# Patient Record
Sex: Female | Born: 1985 | Race: White | Hispanic: No | Smoking: Never smoker
Health system: Southern US, Community
[De-identification: ages and names within clinical notes are randomized; demographics above are authoritative.]

## PROBLEM LIST (undated history)

## (undated) ENCOUNTER — Emergency Department (HOSPITAL_COMMUNITY): Admission: EM | Payer: BLUE CROSS/BLUE SHIELD | Source: Home / Self Care

## (undated) DIAGNOSIS — R0600 Dyspnea, unspecified: Secondary | ICD-10-CM

## (undated) DIAGNOSIS — R519 Headache, unspecified: Secondary | ICD-10-CM

## (undated) DIAGNOSIS — Z8489 Family history of other specified conditions: Secondary | ICD-10-CM

## (undated) DIAGNOSIS — R918 Other nonspecific abnormal finding of lung field: Secondary | ICD-10-CM

## (undated) DIAGNOSIS — F319 Bipolar disorder, unspecified: Secondary | ICD-10-CM

## (undated) DIAGNOSIS — F32A Depression, unspecified: Secondary | ICD-10-CM

## (undated) DIAGNOSIS — T8859XA Other complications of anesthesia, initial encounter: Secondary | ICD-10-CM

## (undated) DIAGNOSIS — F419 Anxiety disorder, unspecified: Secondary | ICD-10-CM

## (undated) DIAGNOSIS — N390 Urinary tract infection, site not specified: Secondary | ICD-10-CM

## (undated) DIAGNOSIS — J189 Pneumonia, unspecified organism: Secondary | ICD-10-CM

## (undated) DIAGNOSIS — R112 Nausea with vomiting, unspecified: Secondary | ICD-10-CM

## (undated) DIAGNOSIS — F329 Major depressive disorder, single episode, unspecified: Secondary | ICD-10-CM

## (undated) DIAGNOSIS — J45909 Unspecified asthma, uncomplicated: Secondary | ICD-10-CM

## (undated) DIAGNOSIS — Z9889 Other specified postprocedural states: Secondary | ICD-10-CM

## (undated) DIAGNOSIS — T4145XA Adverse effect of unspecified anesthetic, initial encounter: Secondary | ICD-10-CM

## (undated) DIAGNOSIS — R51 Headache: Secondary | ICD-10-CM

## (undated) DIAGNOSIS — R569 Unspecified convulsions: Secondary | ICD-10-CM

## (undated) HISTORY — DX: Other nonspecific abnormal finding of lung field: R91.8

## (undated) HISTORY — PX: KNEE ARTHROSCOPY: SUR90

## (undated) HISTORY — PX: OTHER SURGICAL HISTORY: SHX169

## (undated) HISTORY — PX: SKIN GRAFT: SHX250

---
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
## 2006-01-23 ENCOUNTER — Ambulatory Visit: Payer: Self-pay | Admitting: Pediatrics

## 2007-05-18 ENCOUNTER — Ambulatory Visit: Payer: Self-pay | Admitting: Unknown Physician Specialty

## 2007-05-25 ENCOUNTER — Ambulatory Visit: Payer: Self-pay | Admitting: Unknown Physician Specialty

## 2007-05-25 ENCOUNTER — Other Ambulatory Visit: Payer: Self-pay

## 2007-05-28 ENCOUNTER — Ambulatory Visit: Payer: Self-pay | Admitting: Unknown Physician Specialty

## 2008-04-16 ENCOUNTER — Emergency Department: Payer: Self-pay | Admitting: Emergency Medicine

## 2008-09-09 HISTORY — PX: LAPAROSCOPY: SHX197

## 2009-11-06 ENCOUNTER — Ambulatory Visit: Payer: Self-pay | Admitting: Family Medicine

## 2010-02-21 ENCOUNTER — Ambulatory Visit: Payer: Self-pay | Admitting: Family Medicine

## 2012-05-04 ENCOUNTER — Ambulatory Visit: Payer: Self-pay | Admitting: Family Medicine

## 2013-09-09 HISTORY — PX: OTHER SURGICAL HISTORY: SHX169

## 2014-01-26 ENCOUNTER — Ambulatory Visit: Payer: Self-pay | Admitting: Physical Medicine and Rehabilitation

## 2014-09-09 HISTORY — PX: LUMBAR FUSION: SHX111

## 2015-11-05 ENCOUNTER — Encounter: Payer: Self-pay | Admitting: Emergency Medicine

## 2015-11-05 ENCOUNTER — Emergency Department
Admission: EM | Admit: 2015-11-05 | Discharge: 2015-11-05 | Disposition: A | Discharge status: Home / Self Care | Payer: BLUE CROSS/BLUE SHIELD | Attending: Emergency Medicine | Admitting: Emergency Medicine

## 2015-11-05 DIAGNOSIS — R509 Fever, unspecified: Secondary | ICD-10-CM | POA: Diagnosis present

## 2015-11-05 DIAGNOSIS — J069 Acute upper respiratory infection, unspecified: Secondary | ICD-10-CM | POA: Diagnosis not present

## 2015-11-05 DIAGNOSIS — J029 Acute pharyngitis, unspecified: Secondary | ICD-10-CM

## 2015-11-05 DIAGNOSIS — B9789 Other viral agents as the cause of diseases classified elsewhere: Secondary | ICD-10-CM

## 2015-11-05 DIAGNOSIS — J028 Acute pharyngitis due to other specified organisms: Secondary | ICD-10-CM

## 2015-11-05 DIAGNOSIS — J988 Other specified respiratory disorders: Secondary | ICD-10-CM

## 2015-11-05 LAB — MONONUCLEOSIS SCREEN: Mono Screen: NEGATIVE

## 2015-11-05 MED ORDER — IBUPROFEN 800 MG PO TABS
800.0000 mg | ORAL_TABLET | Freq: Once | ORAL | Status: AC
  Administered 2015-11-05: 800 mg via ORAL
  Filled 2015-11-05: qty 1

## 2015-11-05 MED ORDER — IBUPROFEN 600 MG PO TABS: 600.0000 mg | ORAL_TABLET | Freq: Three times a day (TID) | ORAL | Status: DC | PRN

## 2015-11-05 MED ORDER — LIDOCAINE VISCOUS 2 % MT SOLN
15.0000 mL | Freq: Once | OROMUCOSAL | Status: AC
  Administered 2015-11-05: 15 mL via OROMUCOSAL
  Filled 2015-11-05: qty 15

## 2015-11-05 MED ORDER — DIPHENHYDRAMINE HCL 12.5 MG/5ML PO ELIX
25.0000 mg | ORAL_SOLUTION | Freq: Once | ORAL | Status: AC
  Administered 2015-11-05: 25 mg via ORAL
  Filled 2015-11-05: qty 10

## 2015-11-05 MED ORDER — KETOROLAC TROMETHAMINE 60 MG/2ML IM SOLN
60.0000 mg | Freq: Once | INTRAMUSCULAR | Status: AC
  Administered 2015-11-05: 60 mg via INTRAMUSCULAR
  Filled 2015-11-05: qty 2

## 2015-11-05 MED ORDER — PSEUDOEPH-BROMPHEN-DM 30-2-10 MG/5ML PO SYRP: 5.0000 mL | ORAL_SOLUTION | Freq: Four times a day (QID) | ORAL | Status: DC | PRN

## 2015-11-05 MED ORDER — LIDOCAINE VISCOUS 2 % MT SOLN: 5.0000 mL | Freq: Four times a day (QID) | OROMUCOSAL | Status: DC | PRN

## 2015-11-05 NOTE — ED Notes (Signed)
AAOx3.  Skin warm and dry. NAD.  Ambulates with easy and steady gait.  MAE equally and strong. 

## 2015-11-05 NOTE — Discharge Instructions (Signed)
Viral Infections A viral infection can be caused by different types of viruses.Most viral infections are not serious and resolve on their own. However, some infections may cause severe symptoms and may lead to further complications. SYMPTOMS Viruses can frequently cause:  Minor sore throat.  Aches and pains.  Headaches.  Runny nose.  Different types of rashes.  Watery eyes.  Tiredness.  Cough.  Loss of appetite.  Gastrointestinal infections, resulting in nausea, vomiting, and diarrhea. These symptoms do not respond to antibiotics because the infection is not caused by bacteria. However, you might catch a bacterial infection following the viral infection. This is sometimes called a "superinfection." Symptoms of such a bacterial infection may include:  Worsening sore throat with pus and difficulty swallowing.  Swollen neck glands.  Chills and a high or persistent fever.  Severe headache.  Tenderness over the sinuses.  Persistent overall ill feeling (malaise), muscle aches, and tiredness (fatigue).  Persistent cough.  Yellow, green, or brown mucus production with coughing. HOME CARE INSTRUCTIONS   Only take over-the-counter or prescription medicines for pain, discomfort, diarrhea, or fever as directed by your caregiver.  Drink enough water and fluids to keep your urine clear or pale yellow. Sports drinks can provide valuable electrolytes, sugars, and hydration.  Get plenty of rest and maintain proper nutrition. Soups and broths with crackers or rice are fine. SEEK IMMEDIATE MEDICAL CARE IF:   You have severe headaches, shortness of breath, chest pain, neck pain, or an unusual rash.  You have uncontrolled vomiting, diarrhea, or you are unable to keep down fluids.  You or your child has an oral temperature above 102 F (38.9 C), not controlled by medicine.  Your baby is older than 3 months with a rectal temperature of 102 F (38.9 C) or higher.  Your baby is 15  months old or younger with a rectal temperature of 100.4 F (38 C) or higher. MAKE SURE YOU:   Understand these instructions.  Will watch your condition.  Will get help right away if you are not doing well or get worse.   This information is not intended to replace advice given to you by your health care provider. Make sure you discuss any questions you have with your health care provider.   Document Released: 06/05/2005 Document Revised: 11/18/2011 Document Reviewed: 02/01/2015 Elsevier Interactive Patient Education 2016 [employer]  Pharyngitis Pharyngitis is a sore throat (pharynx). There is redness, pain, and swelling of your throat. HOME CARE   Drink enough fluids to keep your pee (urine) clear or pale yellow.  Only take medicine as told by your doctor.  You may get sick again if you do not take medicine as told. Finish your medicines, even if you start to feel better.  Do not take aspirin.  Rest.  Rinse your mouth (gargle) with salt water ( tsp of salt per 1 qt of water) every 1-2 hours. This will help the pain.  If you are not at risk for choking, you can suck on hard candy or sore throat lozenges. GET HELP IF:  You have large, tender lumps on your neck.  You have a rash.  You cough up green, yellow-brown, or bloody spit. GET HELP RIGHT AWAY IF:   You have a stiff neck.  You drool or cannot swallow liquids.  You throw up (vomit) or are not able to keep medicine or liquids down.  You have very bad pain that does not go away with medicine.  You have problems breathing (  not from a stuffy nose). MAKE SURE YOU:   Understand these instructions.  Will watch your condition.  Will get help right away if you are not doing well or get worse.   This information is not intended to replace advice given to you by your health care provider. Make sure you discuss any questions you have with your health care provider.   Document Released: 02/12/2008 Document  Revised: 06/16/2013 Document Reviewed: 05/03/2013 Elsevier Interactive Patient Education [employer].

## 2015-11-05 NOTE — ED Notes (Signed)
C/o fever, cough, sore throat x 3 days.  Seen at urgent care yesterday.  STrep and Flu were negative.  Started on Amoxicillin, but symptoms worsened today.

## 2015-11-05 NOTE — ED Provider Notes (Signed)
[facility] Emergency Department Provider Note  ____________________________________________  Time seen: Approximately 4:37 PM  I have reviewed the triage vital signs and the nursing notes.   HISTORY  Chief Complaint Cough and Fever    HPI Kathy Welch is a 30 y.o. female patient complain fever cough sore throat for 3 days. Patient stated urgent care yesterday. Patient say strep. Rapid flu were negative. Patient started amoxicillin status that has worsened today.Patient state increased fatigue today with nasal and chest congestion.   History reviewed. No pertinent past medical history.  There are no active problems to display for this patient.   Past Surgical History  Procedure Laterality Date  . Back surgeries      Current Outpatient Rx  Name  Route  Sig  Dispense  Refill  . brompheniramine-pseudoephedrine-DM 30-2-10 MG/5ML syrup   Oral   Take 5 mLs by mouth 4 (four) times daily as needed. Mixed with 5 mL of viscous lidocaine for swish and swallow.   120 mL   0   . ibuprofen (ADVIL,MOTRIN) 600 MG tablet   Oral   Take 1 tablet (600 mg total) by mouth every 8 (eight) hours as needed.   15 tablet   0   . lidocaine (XYLOCAINE) 2 % solution   Mouth/Throat   Use as directed 5 mLs in the mouth or throat every 6 (six) hours as needed for mouth pain. Mixed with 5 mL of Bromfed-DM for swish and swallow.   100 mL   0     Allergies Sulfa antibiotics  No family history on file.  Social History Social History  Substance Use Topics  . Smoking status: Never Smoker   . Smokeless tobacco: None  . Alcohol Use: None    Review of Systems Constitutional: Fever and malaise Eyes: No visual changes. ENT sore throat and nasal congestion Cardiovascular: Denies chest pain. Respiratory: Denies shortness of breath. Chest congestion Gastrointestinal: No abdominal pain.  No nausea, no vomiting.  No diarrhea.  No constipation. Genitourinary:  Negative for dysuria. Musculoskeletal: Negative for back pain. Skin: Negative for rash. Neurological: Negative for headaches, focal weakness or numbness. Hematological/Lymphatic: Allergic/Immunilogical: Sulfa antibiotics    ____________________________________________   PHYSICAL EXAM:  VITAL SIGNS: ED Triage Vitals  Enc Vitals Group     BP 11/05/15 1619 124/84 mmHg     Pulse Rate 11/05/15 1619 108     Resp 11/05/15 1616 16     Temp 11/05/15 1616 100.1 F (37.8 C)     Temp src --      SpO2 11/05/15 1619 98 %     Weight 11/05/15 1616 135 lb (61.236 kg)     Height 11/05/15 1616 5\' 2"  (1.575 m)     Head Cir --      Peak Flow --      Pain Score 11/05/15 1617 8     Pain Loc --      Pain Edu? --      Excl. in [place]? --     Constitutional: Alert and oriented. Well appearing and in no acute distress. Eyes: Conjunctivae are normal. PERRL. EOMI. Head: Atraumatic. Nose: No congestion/rhinnorhea. Mouth/Throat: Mucous membranes are moist.  Oropharynx erythematous , exudative tonsils Neck: No stridor.  No cervical spine tenderness to palpation.. Hematological/Lymphatic/Immunilogical: No cervical lymphadenopathy. Cardiovascular: Normal rate, regular rhythm. Grossly normal heart sounds.  Good peripheral circulation. Respiratory: Normal respiratory effort.  No retractions. Lungs CTAB. Gastrointestinal: Soft and nontender. No distention. No abdominal bruits. No CVA tenderness.  Musculoskeletal: No lower extremity tenderness nor edema.  No joint effusions. Neurologic:  Normal speech and language. No gross focal neurologic deficits are appreciated. No gait instability. Skin:  Skin is warm, dry and intact. No rash noted. Psychiatric: Mood and affect are normal. Speech and behavior are normal.  ____________________________________________   LABS (all labs ordered are listed, but only abnormal results are displayed)  Labs Reviewed  MONONUCLEOSIS SCREEN    ____________________________________________  EKG   ____________________________________________  RADIOLOGY   ____________________________________________   PROCEDURES  Procedure(s) performed: None  Critical Care performed: No  ____________________________________________   INITIAL IMPRESSION / ASSESSMENT AND PLAN / ED COURSE  Pertinent labs & imaging results that were available during my care of the patient were reviewed by me and considered in my medical decision making (see chart for details).  Viral illness. Discussed negative Monospot results with patient. Advised patient to continue taking previous medications. Patient given a prescription for Bromfed-DM, viscous lidocaine, and ibuprofen. ____________________________________________   FINAL CLINICAL IMPRESSION(S) / ED DIAGNOSES  Final diagnoses:  Viral respiratory illness  Acute viral pharyngitis      Sable Feil, PA-C 11/05/15 2243  Harvest Dark, MD 11/05/15 2257

## 2016-01-18 ENCOUNTER — Encounter
Admission: RE | Admit: 2016-01-18 | Discharge: 2016-01-18 | Disposition: A | Discharge status: Home / Self Care | Payer: BLUE CROSS/BLUE SHIELD | Source: Ambulatory Visit | Attending: Obstetrics and Gynecology | Admitting: Obstetrics and Gynecology

## 2016-01-18 DIAGNOSIS — Z01812 Encounter for preprocedural laboratory examination: Secondary | ICD-10-CM | POA: Insufficient documentation

## 2016-01-18 HISTORY — DX: Unspecified asthma, uncomplicated: J45.909

## 2016-01-18 HISTORY — DX: Unspecified convulsions: R56.9

## 2016-01-18 HISTORY — DX: Urinary tract infection, site not specified: N39.0

## 2016-01-18 HISTORY — DX: Other complications of anesthesia, initial encounter: T88.59XA

## 2016-01-18 HISTORY — DX: Adverse effect of unspecified anesthetic, initial encounter: T41.45XA

## 2016-01-18 HISTORY — DX: Anxiety disorder, unspecified: F41.9

## 2016-01-18 HISTORY — DX: Bipolar disorder, unspecified: F31.9

## 2016-01-18 LAB — BASIC METABOLIC PANEL
Anion gap: 5 (ref 5–15)
BUN: 15 mg/dL (ref 6–20)
CHLORIDE: 107 mmol/L (ref 101–111)
CO2: 25 mmol/L (ref 22–32)
Calcium: 9.1 mg/dL (ref 8.9–10.3)
Creatinine, Ser: 0.64 mg/dL (ref 0.44–1.00)
GFR calc Af Amer: >60 mL/min (ref >60)
GFR calc non Af Amer: >60 mL/min (ref >60)
GLUCOSE: 86 mg/dL (ref 65–99)
POTASSIUM: 4.4 mmol/L (ref 3.5–5.1)
Sodium: 137 mmol/L (ref 135–145)

## 2016-01-18 LAB — CBC
HEMATOCRIT: 36.6 % (ref 35.0–47.0)
HEMOGLOBIN: 12.4 g/dL (ref 12.0–16.0)
MCH: 30.3 pg (ref 26.0–34.0)
MCHC: 34 g/dL (ref 32.0–36.0)
MCV: 89.3 fL (ref 80.0–100.0)
Platelets: 306 10*3/uL (ref 150–440)
RBC: 4.1 MIL/uL (ref 3.80–5.20)
RDW: 13.8 % (ref 11.5–14.5)
WBC: 6.1 10*3/uL (ref 3.6–11.0)

## 2016-01-18 LAB — SURGICAL PCR SCREEN
MRSA, PCR: NEGATIVE
Staphylococcus aureus: POSITIVE — AB

## 2016-01-18 LAB — TYPE AND SCREEN
ABO/RH(D): A POS
Antibody Screen: NEGATIVE

## 2016-01-18 LAB — ABO/RH: ABO/RH(D): A POS

## 2016-01-18 NOTE — Patient Instructions (Signed)
  Your procedure is scheduled on: 01/22/16 Mon Report to Same Day Surgery 2nd floor medical mall To find out your arrival time please call [phone] between 1PM - 3PM on 01/19/16 Fri  Remember: Instructions that are not followed completely may result in serious medical risk, up to and including death, or upon the discretion of your surgeon and anesthesiologist your surgery may need to be rescheduled.    _x___ 1. Do not eat food or drink liquids after midnight. No gum chewing or hard candies.     __x__ 2. No Alcohol for 24 hours before or after surgery.   ____ 3. Bring all medications with you on the day of surgery if instructed.    __x__ 4. Notify your doctor if there is any change in your medical condition     (cold, fever, infections).     Do not wear jewelry, make-up, hairpins, clips or nail polish.  Do not wear lotions, powders, or perfumes. You may wear deodorant.  Do not shave 48 hours prior to surgery. Men may shave face and neck.  Do not bring valuables to the hospital.    [facility] is not responsible for any belongings or valuables.               Contacts, dentures or bridgework may not be worn into surgery.  Leave your suitcase in the car. After surgery it may be brought to your room.  For patients admitted to the hospital, discharge time is determined by your treatment team.   Patients discharged the day of surgery will not be allowed to drive home.    Please read over the following fact sheets that you were given:   [facility] Preparing for Surgery and or MRSA Information   _x___ Take these medicines the morning of surgery with A SIP OF WATER:    1. sertraline (ZOLOFT) 50 MG tablet  2.  3.  4.  5.  6.  ____ Fleet Enema (as directed)   _x___ Use CHG Soap or sage wipes as directed on instruction sheet   ____ Use inhalers on the day of surgery and bring to hospital day of surgery  ____ Stop metformin 2 days prior to surgery    ____ Take 1/2 of usual  insulin dose the night before surgery and none on the morning of           surgery.   ____ Stop aspirin or coumadin, or plavix  _x__ Stop Anti-inflammatories such as Advil, Aleve, Ibuprofen, Motrin, Naproxen,          Naprosyn, Goodies powders or aspirin products. Ok to take Tylenol.   ____ Stop supplements until after surgery.    ____ Bring C-Pap to the hospital.
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
## 2016-01-18 NOTE — Pre-Procedure Instructions (Signed)
MRSA Neg, Staph Positive faxed results to Dr Leafy Ro.

## 2016-01-22 ENCOUNTER — Ambulatory Visit: Payer: BLUE CROSS/BLUE SHIELD | Admitting: Anesthesiology

## 2016-01-22 ENCOUNTER — Observation Stay
Admission: RE | Admit: 2016-01-22 | Discharge: 2016-01-23 | Disposition: A | Discharge status: Home / Self Care | Payer: BLUE CROSS/BLUE SHIELD | Source: Ambulatory Visit | Attending: Obstetrics and Gynecology | Admitting: Obstetrics and Gynecology

## 2016-01-22 ENCOUNTER — Encounter
Admission: RE | Disposition: A | Discharge status: Home / Self Care | Payer: Self-pay | Source: Ambulatory Visit | Attending: Obstetrics and Gynecology

## 2016-01-22 DIAGNOSIS — F419 Anxiety disorder, unspecified: Secondary | ICD-10-CM | POA: Insufficient documentation

## 2016-01-22 DIAGNOSIS — N803 Endometriosis of pelvic peritoneum: Secondary | ICD-10-CM | POA: Diagnosis not present

## 2016-01-22 DIAGNOSIS — J45909 Unspecified asthma, uncomplicated: Secondary | ICD-10-CM | POA: Diagnosis not present

## 2016-01-22 DIAGNOSIS — Z833 Family history of diabetes mellitus: Secondary | ICD-10-CM | POA: Insufficient documentation

## 2016-01-22 DIAGNOSIS — Z79899 Other long term (current) drug therapy: Secondary | ICD-10-CM | POA: Diagnosis not present

## 2016-01-22 DIAGNOSIS — F319 Bipolar disorder, unspecified: Secondary | ICD-10-CM | POA: Diagnosis not present

## 2016-01-22 DIAGNOSIS — D3610 Benign neoplasm of peripheral nerves and autonomic nervous system, unspecified: Secondary | ICD-10-CM | POA: Diagnosis not present

## 2016-01-22 DIAGNOSIS — Z8249 Family history of ischemic heart disease and other diseases of the circulatory system: Secondary | ICD-10-CM | POA: Insufficient documentation

## 2016-01-22 DIAGNOSIS — Z800 Family history of malignant neoplasm of digestive organs: Secondary | ICD-10-CM | POA: Insufficient documentation

## 2016-01-22 DIAGNOSIS — N736 Female pelvic peritoneal adhesions (postinfective): Secondary | ICD-10-CM | POA: Insufficient documentation

## 2016-01-22 DIAGNOSIS — R1031 Right lower quadrant pain: Secondary | ICD-10-CM | POA: Diagnosis not present

## 2016-01-22 DIAGNOSIS — G43909 Migraine, unspecified, not intractable, without status migrainosus: Secondary | ICD-10-CM | POA: Diagnosis not present

## 2016-01-22 DIAGNOSIS — N809 Endometriosis, unspecified: Secondary | ICD-10-CM | POA: Diagnosis present

## 2016-01-22 DIAGNOSIS — G8929 Other chronic pain: Secondary | ICD-10-CM | POA: Diagnosis not present

## 2016-01-22 DIAGNOSIS — R102 Pelvic and perineal pain: Secondary | ICD-10-CM | POA: Insufficient documentation

## 2016-01-22 DIAGNOSIS — Z882 Allergy status to sulfonamides status: Secondary | ICD-10-CM | POA: Diagnosis not present

## 2016-01-22 HISTORY — PX: LAPAROSCOPIC APPENDECTOMY: SHX408

## 2016-01-22 HISTORY — PX: LAPAROSCOPIC LYSIS OF ADHESIONS: SHX5905

## 2016-01-22 HISTORY — PX: CYSTOSCOPY: SHX5120

## 2016-01-22 HISTORY — PX: CHROMOPERTUBATION: SHX6288

## 2016-01-22 HISTORY — PX: LAPAROSCOPY: SHX197

## 2016-01-22 LAB — PREGNANCY, URINE: PREG TEST UR: NEGATIVE

## 2016-01-22 LAB — CHLAMYDIA/NGC RT PCR (ARMC ONLY)
Chlamydia Tr: NOT DETECTED
N gonorrhoeae: NOT DETECTED

## 2016-01-22 LAB — POCT PREGNANCY, URINE: Preg Test, Ur: NEGATIVE

## 2016-01-22 SURGERY — LAPAROSCOPY, DIAGNOSTIC
Anesthesia: General | Wound class: Clean Contaminated

## 2016-01-22 MED ORDER — FENTANYL CITRATE (PF) 100 MCG/2ML IJ SOLN
25.0000 ug | INTRAMUSCULAR | Status: AC | PRN
  Administered 2016-01-22 (×6): 25 ug via INTRAVENOUS

## 2016-01-22 MED ORDER — LACTATED RINGERS IV SOLN: INTRAVENOUS | Status: DC

## 2016-01-22 MED ORDER — ONDANSETRON HCL 4 MG/2ML IJ SOLN
INTRAMUSCULAR | Status: AC
  Filled 2016-01-22: qty 2

## 2016-01-22 MED ORDER — FENTANYL CITRATE (PF) 100 MCG/2ML IJ SOLN
INTRAMUSCULAR | Status: DC | PRN
  Administered 2016-01-22 (×3): 100 ug via INTRAVENOUS
  Administered 2016-01-22: 150 ug via INTRAVENOUS

## 2016-01-22 MED ORDER — LACTATED RINGERS IV SOLN
INTRAVENOUS | Status: DC
  Administered 2016-01-23: 01:00:00 via INTRAVENOUS

## 2016-01-22 MED ORDER — KETOROLAC TROMETHAMINE 30 MG/ML IJ SOLN
INTRAMUSCULAR | Status: DC | PRN
  Administered 2016-01-22: 30 mg via INTRAVENOUS

## 2016-01-22 MED ORDER — LIDOCAINE HCL (CARDIAC) 20 MG/ML IV SOLN
INTRAVENOUS | Status: DC | PRN
  Administered 2016-01-22: 100 mg via INTRAVENOUS

## 2016-01-22 MED ORDER — ONDANSETRON HCL 4 MG PO TABS: 4.0000 mg | ORAL_TABLET | Freq: Four times a day (QID) | ORAL | Status: DC | PRN

## 2016-01-22 MED ORDER — PROMETHAZINE HCL 25 MG RE SUPP: 25.0000 mg | Freq: Four times a day (QID) | RECTAL | Status: DC | PRN

## 2016-01-22 MED ORDER — SODIUM CHLORIDE 0.9% FLUSH: 9.0000 mL | INTRAVENOUS | Status: DC | PRN

## 2016-01-22 MED ORDER — DIPHENHYDRAMINE HCL 50 MG/ML IJ SOLN: 12.5000 mg | Freq: Four times a day (QID) | INTRAMUSCULAR | Status: DC | PRN

## 2016-01-22 MED ORDER — ACETAMINOPHEN 10 MG/ML IV SOLN
INTRAVENOUS | Status: AC
  Filled 2016-01-22: qty 100

## 2016-01-22 MED ORDER — ROCURONIUM BROMIDE 100 MG/10ML IV SOLN
INTRAVENOUS | Status: DC | PRN
  Administered 2016-01-22: 10 mg via INTRAVENOUS
  Administered 2016-01-22: 15 mg via INTRAVENOUS
  Administered 2016-01-22 (×3): 10 mg via INTRAVENOUS
  Administered 2016-01-22: 35 mg via INTRAVENOUS
  Administered 2016-01-22: 10 mg via INTRAVENOUS

## 2016-01-22 MED ORDER — HYDROMORPHONE HCL 1 MG/ML IJ SOLN
INTRAMUSCULAR | Status: AC
  Administered 2016-01-22: 0.25 mg via INTRAVENOUS
  Filled 2016-01-22: qty 1

## 2016-01-22 MED ORDER — DIPHENHYDRAMINE HCL 12.5 MG/5ML PO ELIX
12.5000 mg | ORAL_SOLUTION | Freq: Four times a day (QID) | ORAL | Status: DC | PRN
  Filled 2016-01-22: qty 5

## 2016-01-22 MED ORDER — ACETAMINOPHEN 10 MG/ML IV SOLN
INTRAVENOUS | Status: DC | PRN
  Administered 2016-01-22: 1000 mg via INTRAVENOUS

## 2016-01-22 MED ORDER — DOCUSATE SODIUM 100 MG PO CAPS
100.0000 mg | ORAL_CAPSULE | Freq: Two times a day (BID) | ORAL | Status: DC
  Administered 2016-01-22 – 2016-01-23 (×2): 100 mg via ORAL
  Filled 2016-01-22 (×2): qty 1

## 2016-01-22 MED ORDER — FAMOTIDINE 20 MG PO TABS
20.0000 mg | ORAL_TABLET | Freq: Once | ORAL | Status: AC
  Administered 2016-01-22: 20 mg via ORAL

## 2016-01-22 MED ORDER — METHYLENE BLUE 0.5 % INJ SOLN
INTRAVENOUS | Status: AC
  Filled 2016-01-22: qty 10

## 2016-01-22 MED ORDER — DOXYCYCLINE HYCLATE 100 MG PO TABS
100.0000 mg | ORAL_TABLET | Freq: Two times a day (BID) | ORAL | Status: DC
  Administered 2016-01-22: 100 mg via ORAL
  Filled 2016-01-22 (×4): qty 1

## 2016-01-22 MED ORDER — PHENYLEPHRINE HCL 10 MG/ML IJ SOLN
INTRAMUSCULAR | Status: DC | PRN
  Administered 2016-01-22 (×2): 200 ug via INTRAVENOUS
  Administered 2016-01-22: 100 ug via INTRAVENOUS
  Administered 2016-01-22: 200 ug via INTRAVENOUS
  Administered 2016-01-22: 100 ug via INTRAVENOUS

## 2016-01-22 MED ORDER — SCOPOLAMINE 1 MG/3DAYS TD PT72
MEDICATED_PATCH | TRANSDERMAL | Status: AC
  Filled 2016-01-22: qty 1

## 2016-01-22 MED ORDER — DOXYCYCLINE HYCLATE 100 MG PO TABS
100.0000 mg | ORAL_TABLET | Freq: Two times a day (BID) | ORAL | Status: DC
  Administered 2016-01-22 – 2016-01-23 (×2): 100 mg via ORAL
  Filled 2016-01-22 (×2): qty 1

## 2016-01-22 MED ORDER — DEXAMETHASONE SODIUM PHOSPHATE 4 MG/ML IJ SOLN
INTRAMUSCULAR | Status: DC | PRN
  Administered 2016-01-22: 5 mg via INTRAVENOUS

## 2016-01-22 MED ORDER — NEOSTIGMINE METHYLSULFATE 10 MG/10ML IV SOLN
INTRAVENOUS | Status: DC | PRN
  Administered 2016-01-22: 4 mg via INTRAVENOUS

## 2016-01-22 MED ORDER — SODIUM CHLORIDE 0.9 % IJ SOLN
INTRAMUSCULAR | Status: AC
  Filled 2016-01-22: qty 50

## 2016-01-22 MED ORDER — ONDANSETRON HCL 4 MG/2ML IJ SOLN
4.0000 mg | Freq: Once | INTRAMUSCULAR | Status: AC | PRN
  Administered 2016-01-22: 4 mg via INTRAVENOUS

## 2016-01-22 MED ORDER — KETOROLAC TROMETHAMINE 30 MG/ML IJ SOLN: 30.0000 mg | Freq: Once | INTRAMUSCULAR | Status: DC

## 2016-01-22 MED ORDER — ONDANSETRON HCL 4 MG/2ML IJ SOLN
INTRAMUSCULAR | Status: DC | PRN
  Administered 2016-01-22: 4 mg via INTRAVENOUS

## 2016-01-22 MED ORDER — FAMOTIDINE 20 MG PO TABS
ORAL_TABLET | ORAL | Status: AC
  Administered 2016-01-22: 20 mg via ORAL
  Filled 2016-01-22: qty 1

## 2016-01-22 MED ORDER — SCOPOLAMINE 1 MG/3DAYS TD PT72
1.0000 | MEDICATED_PATCH | TRANSDERMAL | Status: DC
  Administered 2016-01-22: 1.5 mg via TRANSDERMAL

## 2016-01-22 MED ORDER — HYDROMORPHONE HCL 1 MG/ML IJ SOLN
0.2500 mg | INTRAMUSCULAR | Status: DC | PRN
  Administered 2016-01-22 (×2): 0.25 mg via INTRAVENOUS
  Administered 2016-01-22: 0.5 mg via INTRAVENOUS

## 2016-01-22 MED ORDER — KETAMINE HCL 50 MG/ML IJ SOLN
INTRAMUSCULAR | Status: DC | PRN
  Administered 2016-01-22: 30 mg via INTRAVENOUS
  Administered 2016-01-22: 30 mg via INTRAMUSCULAR

## 2016-01-22 MED ORDER — IBUPROFEN 600 MG PO TABS
600.0000 mg | ORAL_TABLET | Freq: Four times a day (QID) | ORAL | Status: DC | PRN
  Administered 2016-01-23: 600 mg via ORAL
  Filled 2016-01-22: qty 1

## 2016-01-22 MED ORDER — NALOXONE HCL 0.4 MG/ML IJ SOLN: 0.4000 mg | INTRAMUSCULAR | Status: DC | PRN

## 2016-01-22 MED ORDER — GABAPENTIN 300 MG PO CAPS
300.0000 mg | ORAL_CAPSULE | Freq: Three times a day (TID) | ORAL | Status: AC
  Administered 2016-01-22 – 2016-01-23 (×3): 300 mg via ORAL
  Filled 2016-01-22 (×4): qty 1

## 2016-01-22 MED ORDER — SERTRALINE HCL 50 MG PO TABS
50.0000 mg | ORAL_TABLET | Freq: Every day | ORAL | Status: DC
  Administered 2016-01-22 – 2016-01-23 (×2): 50 mg via ORAL
  Filled 2016-01-22 (×2): qty 1

## 2016-01-22 MED ORDER — PROPOFOL 10 MG/ML IV BOLUS
INTRAVENOUS | Status: DC | PRN
  Administered 2016-01-22: 150 mg via INTRAVENOUS
  Administered 2016-01-22: 50 mg via INTRAVENOUS

## 2016-01-22 MED ORDER — FENTANYL CITRATE (PF) 100 MCG/2ML IJ SOLN
INTRAMUSCULAR | Status: AC
  Administered 2016-01-22: 25 ug via INTRAVENOUS
  Filled 2016-01-22: qty 2

## 2016-01-22 MED ORDER — BUPIVACAINE HCL (PF) 0.5 % IJ SOLN
INTRAMUSCULAR | Status: AC
  Filled 2016-01-22: qty 30

## 2016-01-22 MED ORDER — KETOROLAC TROMETHAMINE 30 MG/ML IJ SOLN
30.0000 mg | Freq: Three times a day (TID) | INTRAMUSCULAR | Status: DC
  Administered 2016-01-22 – 2016-01-23 (×2): 30 mg via INTRAVENOUS
  Filled 2016-01-22 (×2): qty 1

## 2016-01-22 MED ORDER — ONDANSETRON HCL 4 MG/2ML IJ SOLN
4.0000 mg | Freq: Four times a day (QID) | INTRAMUSCULAR | Status: DC | PRN
  Administered 2016-01-23: 4 mg via INTRAVENOUS
  Filled 2016-01-22: qty 2

## 2016-01-22 MED ORDER — LACTATED RINGERS IV SOLN
INTRAVENOUS | Status: DC
  Administered 2016-01-22 (×2): via INTRAVENOUS

## 2016-01-22 MED ORDER — ACETAMINOPHEN 500 MG PO TABS
1000.0000 mg | ORAL_TABLET | Freq: Three times a day (TID) | ORAL | Status: DC
  Administered 2016-01-22 – 2016-01-23 (×2): 1000 mg via ORAL
  Filled 2016-01-22 (×2): qty 2

## 2016-01-22 MED ORDER — BUPIVACAINE HCL 0.5 % IJ SOLN
INTRAMUSCULAR | Status: DC | PRN
  Administered 2016-01-22: 14 mL

## 2016-01-22 MED ORDER — TRAMADOL HCL 50 MG PO TABS
50.0000 mg | ORAL_TABLET | Freq: Four times a day (QID) | ORAL | Status: DC | PRN
  Administered 2016-01-22 – 2016-01-23 (×2): 50 mg via ORAL
  Filled 2016-01-22 (×2): qty 1

## 2016-01-22 MED ORDER — MIDAZOLAM HCL 2 MG/2ML IJ SOLN
INTRAMUSCULAR | Status: DC | PRN
  Administered 2016-01-22: 2 mg via INTRAVENOUS

## 2016-01-22 MED ORDER — HYDROMORPHONE 1 MG/ML IV SOLN: INTRAVENOUS | Status: DC

## 2016-01-22 MED ORDER — MENTHOL 3 MG MT LOZG
1.0000 | LOZENGE | OROMUCOSAL | Status: DC | PRN
Start: 2016-01-22 — End: 2016-01-23

## 2016-01-22 MED ORDER — PROCHLORPERAZINE EDISYLATE 5 MG/ML IJ SOLN
10.0000 mg | Freq: Four times a day (QID) | INTRAMUSCULAR | Status: DC | PRN
Start: 2016-01-22 — End: 2016-01-23
  Filled 2016-01-22: qty 2

## 2016-01-22 MED ORDER — MENTHOL 3 MG MT LOZG
1.0000 | LOZENGE | OROMUCOSAL | Status: DC | PRN
  Filled 2016-01-22: qty 9

## 2016-01-22 MED ORDER — GLYCOPYRROLATE 0.2 MG/ML IJ SOLN
INTRAMUSCULAR | Status: DC | PRN
  Administered 2016-01-22: 0.6 mg via INTRAVENOUS

## 2016-01-22 MED ORDER — METHYLENE BLUE 0.5 % INJ SOLN
INTRAVENOUS | Status: DC | PRN
  Administered 2016-01-22: 30 mL

## 2016-01-22 SURGICAL SUPPLY — 56 items
BAG URO DRAIN 2000ML W/SPOUT (MISCELLANEOUS) ×5 IMPLANT
BARRIER ADHS 3X4 INTERCEED (GAUZE/BANDAGES/DRESSINGS) ×5 IMPLANT
BASIN GRAD PLASTIC 32OZ STRL (MISCELLANEOUS) ×5 IMPLANT
BLADE SURG SZ11 CARB STEEL (BLADE) ×5 IMPLANT
CATH FOLEY 2WAY  5CC 16FR (CATHETERS) ×2
CATH ROBINSON RED A/P 16FR (CATHETERS) ×5 IMPLANT
CATH URTH 16FR FL 2W BLN LF (CATHETERS) ×3 IMPLANT
CHLORAPREP W/TINT 26ML (MISCELLANEOUS) ×5 IMPLANT
CLOSURE WOUND 1/4X4 (GAUZE/BANDAGES/DRESSINGS) ×1
CNTNR SPEC 2.5X3XGRAD LEK (MISCELLANEOUS) ×6
CONT SPEC 4OZ STER OR WHT (MISCELLANEOUS) ×4
CONTAINER SPEC 2.5X3XGRAD LEK (MISCELLANEOUS) ×6 IMPLANT
CUTTER FLEX LINEAR 45M (STAPLE) ×5 IMPLANT
DEFOGGER SCOPE WARMER CLEARIFY (MISCELLANEOUS) ×5 IMPLANT
DISSECTOR KITTNER STICK (MISCELLANEOUS) ×3 IMPLANT
DISSECTORS/KITTNER STICK (MISCELLANEOUS) ×5
DRSG OPSITE POSTOP 3X4 (GAUZE/BANDAGES/DRESSINGS) ×20 IMPLANT
DRSG TEGADERM 2-3/8X2-3/4 SM (GAUZE/BANDAGES/DRESSINGS) ×5 IMPLANT
ENDOPOUCH RETRIEVER 10 (MISCELLANEOUS) IMPLANT
GAUZE SPONGE NON-WVN 2X2 STRL (MISCELLANEOUS) ×3 IMPLANT
GLOVE BIO SURGEON STRL SZ 6.5 (GLOVE) ×24 IMPLANT
GLOVE BIO SURGEONS STRL SZ 6.5 (GLOVE) ×6
GLOVE INDICATOR 7.0 STRL GRN (GLOVE) ×5 IMPLANT
GOWN STRL REUS W/ TWL LRG LVL3 (GOWN DISPOSABLE) ×6 IMPLANT
GOWN STRL REUS W/TWL LRG LVL3 (GOWN DISPOSABLE) ×4
IRRIGATION STRYKERFLOW (MISCELLANEOUS) ×3 IMPLANT
IRRIGATOR STRYKERFLOW (MISCELLANEOUS) ×5
IV LACTATED RINGERS 1000ML (IV SOLUTION) ×5 IMPLANT
IV SOD CHL 0.9% 1000ML (IV SOLUTION) ×5 IMPLANT
KIT RM TURNOVER CYSTO AR (KITS) ×5 IMPLANT
LABEL OR SOLS (LABEL) ×5 IMPLANT
LIGASURE 5MM LAPAROSCOPIC (INSTRUMENTS) ×5 IMPLANT
LIQUID BAND (GAUZE/BANDAGES/DRESSINGS) ×10 IMPLANT
NEEDLE VERESS 14GA 120MM (NEEDLE) ×5 IMPLANT
NS IRRIG 500ML POUR BTL (IV SOLUTION) ×5 IMPLANT
PACK GYN LAPAROSCOPIC (MISCELLANEOUS) ×5 IMPLANT
PAD OB MATERNITY 4.3X12.25 (PERSONAL CARE ITEMS) ×5 IMPLANT
PAD PREP 24X41 OB/GYN DISP (PERSONAL CARE ITEMS) ×5 IMPLANT
RELOAD 45 VASCULAR/THIN (ENDOMECHANICALS) ×5 IMPLANT
RELOAD STAPLE TA45 3.5 REG BLU (ENDOMECHANICALS) ×5 IMPLANT
SCISSORS METZENBAUM CVD 33 (INSTRUMENTS) ×5 IMPLANT
SET CYSTO W/LG BORE CLAMP LF (SET/KITS/TRAYS/PACK) ×5 IMPLANT
SLEEVE ENDOPATH XCEL 5M (ENDOMECHANICALS) ×15 IMPLANT
SPONGE VERSALON 2X2 STRL (MISCELLANEOUS) ×2
STRIP CLOSURE SKIN 1/4X4 (GAUZE/BANDAGES/DRESSINGS) ×4 IMPLANT
SURGILUBE 2OZ TUBE FLIPTOP (MISCELLANEOUS) ×5 IMPLANT
SUT MNCRL AB 4-0 PS2 18 (SUTURE) IMPLANT
SUT VIC AB 2-0 UR6 27 (SUTURE) IMPLANT
SUT VIC AB 4-0 SH 27 (SUTURE)
SUT VIC AB 4-0 SH 27XANBCTRL (SUTURE) IMPLANT
SWABSTK COMLB BENZOIN TINCTURE (MISCELLANEOUS) ×5 IMPLANT
SYR 30ML LL (SYRINGE) ×5 IMPLANT
TROCAR ENDO BLADELESS 11MM (ENDOMECHANICALS) ×5 IMPLANT
TROCAR XCEL NON-BLD 5MMX100MML (ENDOMECHANICALS) ×5 IMPLANT
TROCAR XCEL UNIV SLVE 11M 100M (ENDOMECHANICALS) IMPLANT
TUBING INSUFFLATOR HI FLOW (MISCELLANEOUS) ×5 IMPLANT

## 2016-01-22 NOTE — Interval H&P Note (Deleted)
History and Physical Interval Note:  01/22/2016 11:23 AM  Kathy Welch  has presented today for surgery, with the diagnosis of pelvic pain  The various methods of treatment have been discussed with the patient and family. After consideration of risks, benefits and other options for treatment, the patient has consented to  Procedure(s): LAPAROSCOPY DIAGNOSTIC (N/A) LAPAROSCOPY OPERATIVE - destruction of endometriosis (N/A) CYSTOSCOPY (N/A) as a surgical intervention .  The patient's history has been reviewed, patient examined, no change in status, stable for surgery.  I have reviewed the patient's chart and labs.  Questions were answered to the patient's satisfaction.     [name]

## 2016-01-22 NOTE — Anesthesia Procedure Notes (Signed)
Procedure Name: Intubation Date/Time: 01/22/2016 12:14 PM Performed by: Rosaria Ferries, Alcide Memoli Pre-anesthesia Checklist: Patient identified, Emergency Drugs available, Suction available and Patient being monitored Patient Re-evaluated:Patient Re-evaluated prior to inductionOxygen Delivery Method: Circle system utilized Preoxygenation: Pre-oxygenation with 100% oxygen Intubation Type: IV induction Laryngoscope Size: Mac and 3 Tube size: 7.0 mm Number of attempts: 1 Placement Confirmation: ETT inserted through vocal cords under direct vision,  positive ETCO2 and breath sounds checked- equal and bilateral Secured at: 21 cm Tube secured with: Tape Dental Injury: Teeth and Oropharynx as per pre-operative assessment

## 2016-01-22 NOTE — Op Note (Addendum)
Kathy Welch PROCEDURE DATE: 01/22/2016  PREOPERATIVE DIAGNOSIS: Chronic pelvic pain, known endometriosis POSTOPERATIVE DIAGNOSIS:  PROCEDURE: Diagnostic laparoscopy, lysis of adhesions for >50% of the case, excision of endometriosis, scoring of endometriosis, cystoscopy, chromotubation, pap smear, and laparoscopic appendectomy by Dr. Bary Castilla. SURGEON:  Dr. [name] ASSISTANT: Dr. Marcello Moores Schermerhorn ANESTHESIOLOGIST: No responsible provider has been recorded for the case. Anesthesiologist: Martha Clan, MD CRNA: Bernardo Heater, CRNA; Jonna Clark, CRNA  INDICATIONS: 30 y.o. No obstetric history on file. with history of chronic pelvic pain desiring surgical evaluation.   Please see preoperative notes for further details.  Risks of surgery were discussed with the patient including but not limited to: bleeding which may require transfusion or reoperation; infection which may require antibiotics; injury to bowel, bladder, ureters or other surrounding organs; need for additional procedures including laparotomy; thromboembolic phenomenon, incisional problems and other postoperative/anesthesia complications. Written informed consent was obtained.    FINDINGS:    No thickening or nodularity in the rectovaginal septum.  Omental adhesions in the midline just inferior to the umbilical port that were taken down for visualization. No bowel was in these adhesions. Normal left and right upper quadrants, normal diaphragm.  Small uterus, normal left ovary and fallopian tube. Right ovary and mid-Fallopian tube densely adherent to the sigmoid colon; the ovary was unable to be moved in its fossa. The appendix was also wrapped into the ovarian adhesions for about 3cm at its tip.  There was diffuse evidence of clear bleb endometriosis lesions in the posterior cul-de-sac, particularly along the uterosacral ligament on the right. 2 allen-masters windows were noted just left of midline in the posterior  culdesac. The cul de sac was not obliterated. Peritoneal excisions over this site were sent to pathology. No other abdominal/pelvic abnormality.   Endometriosis grading score: 11- mild endometriosis  ANESTHESIA:    General INTRAVENOUS FLUIDS: 1700 ml ESTIMATED BLOOD LOSS: 50 ml URINE OUTPUT: 200 ml SPECIMENS: Peritoneal biopsies COMPLICATIONS: None immediate  PROCEDURE IN DETAIL:  The patient did have po doxycycline in the preop holding area. The patient had sequential compression devices applied to her lower extremities while in the preoperative area.  She was then taken to the operating room where general anesthesia was administered and was found to be adequate.  A pap smear was taken with the exam under anesthesia.   She was placed in the dorsal lithotomy position, and was prepped and draped in a sterile manner.  A Foley catheter was inserted into her bladder and attached to constant drainage and a uterine manipulator was then advanced into the uterus .  After an adequate timeout was performed and confirming an OG tube in place, attention was turned to Palmer's point. A stab incision was made to admit a Veress needle. The abdomen was then insufflated with carbon dioxide gas and adequate pneumoperitoneum was obtained.    A 20mm umbilical incision was made with the scalpel.  The Optiview 5-mm trocar and sleeve were then advanced without difficulty with the laparoscope under direct visualization into the abdomen.  A careful and detailed survey of the patient's pelvis and abdomen revealed the findings as mentioned above.  Chromotubation with methylene blue confirmed spillage from bilateral fimbriae. Dilute methylene blue was sprayed over the peritoneal surfaces to add in visualization of endometriotic implants. The blebs were subtle and difficult to see. A fourth port site was required to achieve the safest angles for this portion of the procedure.   Monopolar electrocautery was carefully used to  fulgurate the  endometriosis blebs that were safely destroyed in the posterior cul de sac, including along the uterosacral ligaments gently.  Using the Harmonic, cold and monopolar scissors and blunt dissection, the appendix was freed from the right adnexa. The ovary was peeled off the adherent bowel with great care, using no electrocautery for the dissection. A Harmonic device was used to assure hemostasis where appropriate. The appendix was unable to be fully freed out of the pelvis. A call was made to general surgery to consider appendectomy, as this organ was intimately involved in the frozen side and though we were able to free it from the ovary, it was adherent over the pelvic brim into the right adnexa and appeared that it would remain in this site. Please see Dr. Dwyane Luo note for this procedure, which included extending the LLQ port site to a 50mm trocar.  The tube and the right ovary were freed from the bowel, which was swept out of the pelvis.  Scissors were used to take peritoneal biopsies.  The operative site was surveyed, and it was found to be hemostatic.  No intraoperative injury to surrounding organs was noted.  Pictures were taken of the quadrants and pelvis. The abdomen was desufflated and all instruments were then removed from the patient's abdomen. The 29mm LLQ port site fascia was closed using 0-Vicryl suture under direct observation.   The uterine manipulator was removed without complications.  All incisions were closed with 4-0 Vicryl and Dermabond.   CYSTOCOPY PARAGRAPH The Foley catheter was temporarily removed and an uncomplicated cystoscopy was performed. 800 mL of fluid was instilled for hydro-distention, drained, and the bladder mucosa was visualized again with the above findings. Excellent efflux was noted from both ureteral orifices.  The patient tolerated the procedures well.  Toradol and iv tylenol were given prior to the conclusion of the procedure. She will likely  need to be admitted overnight for pain control.  All instruments, needles, and sponge counts were correct x 2. The patient was taken to the recovery room in stable condition.

## 2016-01-22 NOTE — Interval H&P Note (Signed)
History and Physical Interval Note:  01/22/2016 11:24 AM  Kathy Welch  has presented today for surgery, with the diagnosis of pelvic pain  The various methods of treatment have been discussed with the patient and family. After consideration of risks, benefits and other options for treatment, the patient has consented to  Procedure(s): LAPAROSCOPY DIAGNOSTIC (N/A) LAPAROSCOPY OPERATIVE - destruction of endometriosis (N/A) CYSTOSCOPY (N/A) with chromotubation, pap smear, possible lysis of adhesions as a surgical intervention .  The patient's history has been reviewed, patient examined, no change in status, stable for surgery.  I have reviewed the patient's chart and labs.  Questions were answered to the patient's satisfaction.     [name]

## 2016-01-22 NOTE — Op Note (Signed)
Preoperative diagnosis: Chronic right lower quadrant pain area  Postoperative diagnosis same.  Operative procedure appendectomy.  Operating surgeon: Hervey Ard, M.D., co-surgeon: [name], M.D.  Anesthesia Gen. endotracheal.  Estimated blood loss: None.  Clinical note: This 30 year old woman was undergoing a diagnostic laparoscopy for chronic pelvic pain and was identified with scarring of the appendix adjacent to the right tubo-ovarian complex. No dominant source for pain was identified and she is felt to be a candidate for appendectomy to help minimize the chance of recurrent pelvic pain.  The 5 mm left lower quadrant port was exchanged for a 12 mm XL port. The base of the appendix was cleared and was divided with a Endo GIA blue cartridge. The Endo GIA white cartridge did not fire correctly and the mesentery was taken down with the Harmonic scalpel. The appendix was placed in an Endo Catch bag and then delivered to the 12 mm port site. Inspection showed excellent hemostasis.  The area was irrigated and the procedure turned back over to Dr. Leafy Ro for completion.

## 2016-01-22 NOTE — Anesthesia Preprocedure Evaluation (Signed)
Anesthesia Evaluation  Patient identified by MRN, date of birth, ID band Patient awake    Reviewed: Allergy & Precautions, H&P , NPO status , Patient's Chart, lab work & pertinent test results, reviewed documented beta blocker date and time   History of Anesthesia Complications (+) PONV and history of anesthetic complications  Airway Mallampati: I  TM Distance: >3 FB Neck ROM: full    Dental no notable dental hx. (+) Teeth Intact   Pulmonary neg shortness of breath, asthma (as a child) , neg sleep apnea, neg COPD, Recent URI , Resolved,    Pulmonary exam normal breath sounds clear to auscultation       Cardiovascular Exercise Tolerance: Good negative cardio ROS Normal cardiovascular exam Rhythm:regular Rate:Normal     Neuro/Psych Seizures - (Once as a child),  PSYCHIATRIC DISORDERS (Bipolar)    GI/Hepatic negative GI ROS, Neg liver ROS,   Endo/Other  negative endocrine ROS  Renal/GU negative Renal ROS  negative genitourinary   Musculoskeletal   Abdominal   Peds  Hematology negative hematology ROS (+)   Anesthesia Other Findings Past Medical History:   Asthma                                                         Comment:as a child   Anxiety                                                      Bipolar disorder ([place])                                       UTI (lower urinary tract infection)                          Seizures ([place])                                                 Comment:"at age 30 after giving blood"   Complication of anesthesia                                     Comment:nausea   Reproductive/Obstetrics negative OB ROS                             Anesthesia Physical Anesthesia Plan  ASA: II  Anesthesia Plan: General   Post-op Pain Management:    Induction:   Airway Management Planned:   Additional Equipment:   Intra-op Plan:   Post-operative Plan:    Informed Consent: I have reviewed the patients History and Physical, chart, labs and discussed the procedure including the risks, benefits and alternatives for the proposed anesthesia with the patient or authorized representative who has indicated his/her understanding and acceptance.   Dental Advisory Given  Plan Discussed with: Anesthesiologist,  CRNA and Surgeon  Anesthesia Plan Comments:         Anesthesia Quick Evaluation

## 2016-01-22 NOTE — Transfer of Care (Signed)
Immediate Anesthesia Transfer of Care Note  Patient: Kathy Welch  Procedure(s) Performed: Procedure(s): LAPAROSCOPY DIAGNOSTIC/ pap smear (N/A) LAPAROSCOPY OPERATIVE - excision of endometriosis (N/A) CYSTOSCOPY (N/A) CHROMOPERTUBATION (Bilateral) LAPAROSCOPIC LYSIS OF ADHESIONS (Bilateral) APPENDECTOMY LAPAROSCOPIC  Patient Location: PACU  Anesthesia Type:General  Level of Consciousness: patient cooperative and lethargic  Airway & Oxygen Therapy: Patient Spontanous Breathing and Patient connected to face mask oxygen  Post-op Assessment: Report given to RN and Post -op Vital signs reviewed and stable  Post vital signs: Reviewed and stable  Last Vitals:  Filed Vitals:   01/22/16 1049 01/22/16 1615  BP: 109/86 111/65  Pulse: 83 105  Temp: 37.2 C 36.4 C  Resp: 16 22    Last Pain:  Filed Vitals:   01/22/16 1616  PainSc: 5          Complications: No apparent anesthesia complications

## 2016-01-23 ENCOUNTER — Encounter: Payer: Self-pay | Admitting: Obstetrics and Gynecology

## 2016-01-23 DIAGNOSIS — D3610 Benign neoplasm of peripheral nerves and autonomic nervous system, unspecified: Secondary | ICD-10-CM | POA: Diagnosis not present

## 2016-01-23 LAB — CBC
HEMATOCRIT: 32 % — AB (ref 35.0–47.0)
HEMOGLOBIN: 10.8 g/dL — AB (ref 12.0–16.0)
MCH: 29.9 pg (ref 26.0–34.0)
MCHC: 33.6 g/dL (ref 32.0–36.0)
MCV: 89.1 fL (ref 80.0–100.0)
Platelets: 303 10*3/uL (ref 150–440)
RBC: 3.59 MIL/uL — AB (ref 3.80–5.20)
RDW: 13.7 % (ref 11.5–14.5)
WBC: 9.7 10*3/uL (ref 3.6–11.0)

## 2016-01-23 LAB — BASIC METABOLIC PANEL
ANION GAP: 4 — AB (ref 5–15)
BUN: 11 mg/dL (ref 6–20)
CHLORIDE: 106 mmol/L (ref 101–111)
CO2: 26 mmol/L (ref 22–32)
Calcium: 8.6 mg/dL — ABNORMAL LOW (ref 8.9–10.3)
Creatinine, Ser: 0.74 mg/dL (ref 0.44–1.00)
GFR calc non Af Amer: >60 mL/min (ref >60)
Glucose, Bld: 144 mg/dL — ABNORMAL HIGH (ref 65–99)
Potassium: 4 mmol/L (ref 3.5–5.1)
SODIUM: 136 mmol/L (ref 135–145)

## 2016-01-23 MED ORDER — DOCUSATE SODIUM 100 MG PO CAPS: 100.0000 mg | ORAL_CAPSULE | Freq: Two times a day (BID) | ORAL | Status: DC

## 2016-01-23 MED ORDER — PROMETHAZINE HCL 25 MG RE SUPP
25.0000 mg | Freq: Four times a day (QID) | RECTAL | Status: DC | PRN
Start: 2016-01-23 — End: 2017-02-13

## 2016-01-23 MED ORDER — ACETAMINOPHEN 500 MG PO TABS: 1000.0000 mg | ORAL_TABLET | Freq: Three times a day (TID) | ORAL | Status: DC

## 2016-01-23 MED ORDER — ONDANSETRON 4 MG PO TBDP: 4.0000 mg | ORAL_TABLET | Freq: Four times a day (QID) | ORAL | Status: DC | PRN

## 2016-01-23 MED ORDER — OXYCODONE HCL 5 MG PO TABS: 5.0000 mg | ORAL_TABLET | ORAL | Status: DC | PRN

## 2016-01-23 MED ORDER — OXYCODONE-ACETAMINOPHEN 5-325 MG PO TABS
1.0000 | ORAL_TABLET | Freq: Four times a day (QID) | ORAL | Status: DC | PRN
  Administered 2016-01-23 (×2): 2 via ORAL
  Filled 2016-01-23 (×2): qty 2

## 2016-01-23 MED ORDER — DOXYCYCLINE HYCLATE 100 MG PO TABS: 100.0000 mg | ORAL_TABLET | Freq: Two times a day (BID) | ORAL | Status: AC

## 2016-01-23 NOTE — Anesthesia Postprocedure Evaluation (Signed)
Anesthesia Post Note  Patient: Caleah N Karapetian  Procedure(s) Performed: Procedure(s) (LRB): LAPAROSCOPY DIAGNOSTIC/ pap smear (N/A) LAPAROSCOPY OPERATIVE - excision of endometriosis (N/A) CYSTOSCOPY (N/A) CHROMOPERTUBATION (Bilateral) LAPAROSCOPIC LYSIS OF ADHESIONS (Bilateral) APPENDECTOMY LAPAROSCOPIC  Patient location during evaluation: PACU Anesthesia Type: General Level of consciousness: awake and alert Pain management: pain level controlled Vital Signs Assessment: post-procedure vital signs reviewed and stable Respiratory status: spontaneous breathing, nonlabored ventilation, respiratory function stable and patient connected to nasal cannula oxygen Cardiovascular status: blood pressure returned to baseline and stable Postop Assessment: no signs of nausea or vomiting Anesthetic complications: no    Last Vitals:  Filed Vitals:   01/23/16 0732 01/23/16 1242  BP: 105/65   Pulse: 57   Temp: 36.8 C 36.7 C  Resp: 18     Last Pain:  Filed Vitals:   01/23/16 1243  PainSc: 6                  Martha Clan

## 2016-01-23 NOTE — Discharge Summary (Signed)
Physician Discharge Summary  Patient ID: Kathy Welch MRN: [id number] DOB/AGE: [birth year] 29 y.o.  Admit date: 01/22/2016 Discharge date: 01/23/2016  Admission Diagnoses:  Discharge Diagnoses:  Active Problems:   Endometriosis determined by laparoscopy   Discharged Condition: good  Hospital Course: admitted for overnight pain control after dx lap for endometriosis. By postop day #1, patient pain control with po meds, tolerating po with minimal nausea, ambulating without assistance.   Consults: None  Significant Diagnostic Studies: labs: CBC, CMP wnl.  Treatments: IV hydration and analgesia: acetaminophen, Dilaudid and oxycodone, gabapentin, toradol  Discharge Exam: Blood pressure 105/65, pulse 57, temperature 98.3 F (36.8 C), temperature source Oral, resp. rate 18, weight 135 lb (61.236 kg), last menstrual period 12/23/2015, SpO2 100 %. General appearance: alert, cooperative and appears stated age Head: Normocephalic, without obvious abnormality, atraumatic Resp: clear to auscultation bilaterally and normal percussion bilaterally Cardio: regular rate and rhythm, S1, S2 normal, no murmur, click, rub or gallop GI: soft, non-tender; bowel sounds normal; no masses,  no organomegaly and appropriately tender, minimally bloated, good bowel sounds  Disposition: 01-Home or Self Care     Medication List    STOP taking these medications        sertraline 50 MG tablet  Commonly known as:  ZOLOFT      TAKE these medications        acetaminophen 500 MG tablet  Commonly known as:  TYLENOL  Take 2 tablets (1,000 mg total) by mouth every 8 (eight) hours.     docusate sodium 100 MG capsule  Commonly known as:  COLACE  Take 1 capsule (100 mg total) by mouth 2 (two) times daily.     doxycycline 100 MG tablet  Commonly known as:  VIBRA-TABS  Take 1 tablet (100 mg total) by mouth every 12 (twelve) hours.     oxyCODONE 5 MG immediate release tablet  Commonly known as:   ROXICODONE  Take 1 tablet (5 mg total) by mouth every 4 (four) hours as needed for severe pain.     promethazine 25 MG suppository  Commonly known as:  PHENERGAN  Place 1 suppository (25 mg total) rectally every 6 (six) hours as needed for nausea or vomiting.           Follow-up Information    Follow up with [name], MD In 2 weeks.   Specialty:  Obstetrics and Gynecology   Why:  For postop check   Contact information:   [address] [place] [address] [phone]       Signed: [name] 01/23/2016, 8:43 AM

## 2016-01-23 NOTE — Progress Notes (Signed)
Discharge instructions reviewed with patient.  All questions answered.  Follow up appointment scheduled.  

## 2016-01-23 NOTE — Discharge Instructions (Signed)
Laparoscopic Ovarian Surgery Discharge Instructions ° °RISKS AND COMPLICATIONS  °· Infection. °· Bleeding. °· Injury to surrounding organs. °· Anesthetic side effects. ° ° °PROCEDURE  °· You may be given a medicine to help you relax (sedative) before the procedure. You will be given a medicine to make you sleep (general anesthetic) during the procedure. °· A tube will be put down your throat to help your breath while under general anesthesia. °· Several small cuts (incisions) are made in the lower abdominal area and one incision is made near the belly button. °· Your abdominal area will be inflated with a safe gas (carbon dioxide). This helps give the surgeon room to operate, visualize, and helps the surgeon avoid other organs. °· A thin, lighted tube (laparoscope) with a camera attached is inserted into your abdomen through the incision near the belly button. Other small instruments may also be inserted through other abdominal incisions. °· The ovary is located and are removed. °· After the ovary is removed, the gas is released from the abdomen. °· The incisions will be closed with stitches (sutures), and Dermabond. A bandage may be placed over the incisions. ° °AFTER THE PROCEDURE  °· You will also have some mild abdominal discomfort for 3-7 days. You will be given pain medicine to ease any discomfort. °· As long as there are no problems, you may be allowed to go home. Someone will need to drive you home and be with you for at least 24 hours once home. °· You may have some mild discomfort in the throat. This is from the tube placed in your throat while you were sleeping. °· You may experience discomfort in the shoulder area from some trapped air between the liver and diaphragm. This sensation is normal and will slowly go away on its own. ° °HOME CARE INSTRUCTIONS  °· Take all medicines as directed. °· Only take over-the-counter or prescription medicines for pain, discomfort, or fever as directed by your  caregiver. °· Resume daily activities as directed. °· Showers are preferred over baths for 2 weeks. °· You may resume sexual activities in 1 week or as you feel you would like to. °· Do not drive while taking narcotics. ° °SEEK MEDICAL CARE IF: . °· There is increasing abdominal pain. °· You feel lightheaded or faint. °· You have the chills. °· You have an oral temperature above 102° F (38.9° C). °· There is pus-like (purulent) drainage from any of the wounds. °· You are unable to pass gas or have a bowel movement. °· You feel sick to your stomach (nauseous) or throw up (vomit) and can't control it with your medicines. ° °MAKE SURE YOU:  °· Understand these instructions. °· Will watch your condition. °· Will get help right away if you are not doing well or get worse. ° °[employer]® Patient Information ©2013 [employer]. ° ° ° ° ° °

## 2016-01-24 LAB — URINE CULTURE: Culture: NO GROWTH

## 2016-01-24 LAB — SURGICAL PATHOLOGY

## 2016-04-04 ENCOUNTER — Ambulatory Visit: Payer: BLUE CROSS/BLUE SHIELD | Admitting: Pain Medicine

## 2016-06-03 ENCOUNTER — Ambulatory Visit: Payer: BLUE CROSS/BLUE SHIELD | Attending: Obstetrics and Gynecology | Admitting: Physical Therapy

## 2016-06-14 ENCOUNTER — Encounter: Payer: BLUE CROSS/BLUE SHIELD | Admitting: Physical Therapy

## 2016-06-19 ENCOUNTER — Encounter: Payer: BLUE CROSS/BLUE SHIELD | Admitting: Physical Therapy

## 2016-06-21 ENCOUNTER — Emergency Department
Admission: EM | Admit: 2016-06-21 | Discharge: 2016-06-21 | Disposition: A | Discharge status: Home / Self Care | Payer: BLUE CROSS/BLUE SHIELD | Attending: Emergency Medicine | Admitting: Emergency Medicine

## 2016-06-21 ENCOUNTER — Encounter: Payer: Self-pay | Admitting: Emergency Medicine

## 2016-06-21 DIAGNOSIS — G43809 Other migraine, not intractable, without status migrainosus: Secondary | ICD-10-CM | POA: Diagnosis not present

## 2016-06-21 DIAGNOSIS — J45909 Unspecified asthma, uncomplicated: Secondary | ICD-10-CM | POA: Insufficient documentation

## 2016-06-21 DIAGNOSIS — Z79899 Other long term (current) drug therapy: Secondary | ICD-10-CM | POA: Insufficient documentation

## 2016-06-21 DIAGNOSIS — G43909 Migraine, unspecified, not intractable, without status migrainosus: Secondary | ICD-10-CM | POA: Diagnosis present

## 2016-06-21 MED ORDER — GABAPENTIN 600 MG PO TABS: 600.0000 mg | ORAL_TABLET | Freq: Every day | ORAL | 2 refills | Status: DC

## 2016-06-21 MED ORDER — SODIUM CHLORIDE 0.9 % IV SOLN
Freq: Once | INTRAVENOUS | Status: AC
  Administered 2016-06-21: 18:00:00 via INTRAVENOUS

## 2016-06-21 MED ORDER — RIZATRIPTAN BENZOATE 10 MG PO TBDP: 10.0000 mg | ORAL_TABLET | ORAL | 0 refills | Status: DC | PRN

## 2016-06-21 MED ORDER — KETOROLAC TROMETHAMINE 30 MG/ML IJ SOLN
30.0000 mg | Freq: Once | INTRAMUSCULAR | Status: AC
  Administered 2016-06-21: 30 mg via INTRAVENOUS
  Filled 2016-06-21: qty 1

## 2016-06-21 MED ORDER — DIPHENHYDRAMINE HCL 50 MG/ML IJ SOLN
25.0000 mg | Freq: Once | INTRAMUSCULAR | Status: AC
  Administered 2016-06-21: 25 mg via INTRAVENOUS
  Filled 2016-06-21: qty 1

## 2016-06-21 MED ORDER — METOCLOPRAMIDE HCL 5 MG/ML IJ SOLN
10.0000 mg | Freq: Once | INTRAMUSCULAR | Status: AC
  Administered 2016-06-21: 10 mg via INTRAVENOUS
  Filled 2016-06-21 (×2): qty 2

## 2016-06-21 NOTE — ED Notes (Signed)
Pt verbalized understanding of discharge instructions. NAD at this time. 

## 2016-06-21 NOTE — ED Provider Notes (Signed)
[facility] Emergency Department Provider Note        Time seen: ----------------------------------------- 5:25 PM on 06/21/2016 -----------------------------------------    I have reviewed the triage vital signs and the nursing notes.   HISTORY  Chief Complaint No chief complaint on file.    HPI Kathy Welch is a 30 y.o. female who presents to the ER formigraine headache for the last approximately 11 days. Patient does report a history of migraines, states this was different because it's near her left eye. She has had vision changes in the left eye which is normal for her. Typically her migraine headaches involve both sides of the head posteriorly. She states lights and sound bothers her, she gets nauseous. She used to take gabapentin and Maxalt for her symptoms but has not needed any in 2 years. She denies recent illness or other complaints.   Past Medical History:  Diagnosis Date  . Anxiety   . Asthma    as a child  . Bipolar disorder ([place])   . Complication of anesthesia    nausea  . Seizures ([place])    "at age 12 after giving blood"  . UTI (lower urinary tract infection)     Patient Active Problem List   Diagnosis Date Noted  . Endometriosis determined by laparoscopy 01/22/2016    Past Surgical History:  Procedure Laterality Date  . back surgeries    . CHROMOPERTUBATION Bilateral 01/22/2016   Procedure: CHROMOPERTUBATION;  Surgeon: [name], MD;  Location: [facility] ORS;  Service: Gynecology;  Laterality: Bilateral;  . CYSTOSCOPY N/A 01/22/2016   Procedure: CYSTOSCOPY;  Surgeon: [name], MD;  Location: [facility] ORS;  Service: Gynecology;  Laterality: N/A;  . KNEE ARTHROSCOPY Right   . LAPAROSCOPIC APPENDECTOMY  01/22/2016   Procedure: APPENDECTOMY LAPAROSCOPIC;  Surgeon: [name], MD;  Location: [facility] ORS;  Service: Gynecology;;  . LAPAROSCOPIC LYSIS OF ADHESIONS Bilateral 01/22/2016   Procedure: LAPAROSCOPIC LYSIS OF  ADHESIONS;  Surgeon: [name], MD;  Location: [facility] ORS;  Service: Gynecology;  Laterality: Bilateral;  . LAPAROSCOPY    . LAPAROSCOPY N/A 01/22/2016   Procedure: LAPAROSCOPY DIAGNOSTIC/ pap smear;  Surgeon: [name], MD;  Location: [facility] ORS;  Service: Gynecology;  Laterality: N/A;  . LAPAROSCOPY N/A 01/22/2016   Procedure: LAPAROSCOPY OPERATIVE - excision of endometriosis;  Surgeon: [name], MD;  Location: [facility] ORS;  Service: Gynecology;  Laterality: N/A;  . SKIN GRAFT     lip    Allergies Sulfa antibiotics  Social History Social History  Substance Use Topics  . Smoking status: Never Smoker  . Smokeless tobacco: Not on file  . Alcohol use Yes     Comment: occ    Review of Systems Constitutional: Negative for fever. Cardiovascular: Negative for chest pain. Respiratory: Negative for shortness of breath. Gastrointestinal: Negative for abdominal pain, vomiting and diarrhea. Positive for nausea Genitourinary: Negative for dysuria. Musculoskeletal: Negative for back pain. Skin: Negative for rash. Neurological: Positive for headache  10-point ROS otherwise negative.  ____________________________________________   PHYSICAL EXAM:  VITAL SIGNS: ED Triage Vitals  Enc Vitals Group     BP      Pulse      Resp      Temp      Temp src      SpO2      Weight      Height      Head Circumference      Peak Flow      Pain Score  Pain Loc      Pain Edu?      Excl. in [place]?     Constitutional: Alert and oriented. Well appearing and in no distress. Eyes: Conjunctivae are normal. PERRL. Normal extraocular movements. ENT   Head: Normocephalic and atraumatic.   Nose: No congestion/rhinnorhea.   Mouth/Throat: Mucous membranes are moist.   Neck: No stridor. Cardiovascular: Normal rate, regular rhythm. No murmurs, rubs, or gallops. Respiratory: Normal respiratory effort without tachypnea nor retractions. Breath sounds are clear and equal  bilaterally. No wheezes/rales/rhonchi. Gastrointestinal: Soft and nontender. Normal bowel sounds Musculoskeletal: Nontender with normal range of motion in all extremities. No lower extremity tenderness nor edema. Neurologic:  Normal speech and language. No gross focal neurologic deficits are appreciated.  Skin:  Skin is warm, dry and intact. No rash noted. Psychiatric: Mood and affect are normal. Speech and behavior are normal.  ____________________________________________  ED COURSE:  Pertinent labs & imaging results that were available during my care of the patient were reviewed by me and considered in my medical decision making (see chart for details). Clinical Course  Patient does not appear to be in any acute distress, we will give IV headache cocktail and reevaluate.  Procedures ____________________________________________   LABS (pertinent positives/negatives)  Labs Reviewed  POC URINE PREG, ED   ____________________________________________  FINAL ASSESSMENT AND PLAN  Headache  Plan: Patient with labs and imaging as dictated above. Patient currently feeling better, states she is improved to 4 out of 10. She is requesting to go home with her previous prescriptions for migraine headaches. She is stable for discharge.   Earleen Newport, MD   Note: This dictation was prepared with Dragon dictation. Any transcriptional errors that result from this process are unintentional    Earleen Newport, MD 06/21/16 [id number]

## 2016-06-21 NOTE — ED Triage Notes (Signed)
Pt arrived to ED with c/o migraine that has been ongoing for 11 days. Pt has hx of migraines.

## 2016-07-01 ENCOUNTER — Encounter: Payer: BLUE CROSS/BLUE SHIELD | Admitting: Physical Therapy

## 2016-07-17 ENCOUNTER — Encounter: Payer: BLUE CROSS/BLUE SHIELD | Admitting: Physical Therapy

## 2016-07-26 ENCOUNTER — Other Ambulatory Visit: Payer: Self-pay | Admitting: Internal Medicine

## 2016-07-26 ENCOUNTER — Ambulatory Visit
Admission: RE | Admit: 2016-07-26 | Discharge: 2016-07-26 | Disposition: A | Discharge status: Home / Self Care | Payer: BLUE CROSS/BLUE SHIELD | Source: Ambulatory Visit | Attending: Internal Medicine | Admitting: Internal Medicine

## 2016-07-26 DIAGNOSIS — R918 Other nonspecific abnormal finding of lung field: Secondary | ICD-10-CM | POA: Diagnosis not present

## 2016-07-26 DIAGNOSIS — R0602 Shortness of breath: Secondary | ICD-10-CM | POA: Insufficient documentation

## 2016-07-26 MED ORDER — IOPAMIDOL (ISOVUE-370) INJECTION 76%
75.0000 mL | Freq: Once | INTRAVENOUS | Status: AC | PRN
  Administered 2016-07-26: 75 mL via INTRAVENOUS

## 2016-07-31 ENCOUNTER — Encounter: Payer: BLUE CROSS/BLUE SHIELD | Admitting: Physical Therapy

## 2016-08-10 ENCOUNTER — Encounter: Payer: Self-pay | Admitting: Emergency Medicine

## 2016-08-10 ENCOUNTER — Emergency Department: Payer: BLUE CROSS/BLUE SHIELD

## 2016-08-10 ENCOUNTER — Emergency Department
Admission: EM | Admit: 2016-08-10 | Discharge: 2016-08-10 | Disposition: A | Discharge status: Home / Self Care | Payer: BLUE CROSS/BLUE SHIELD | Attending: Emergency Medicine | Admitting: Emergency Medicine

## 2016-08-10 DIAGNOSIS — J45909 Unspecified asthma, uncomplicated: Secondary | ICD-10-CM | POA: Diagnosis not present

## 2016-08-10 DIAGNOSIS — R0602 Shortness of breath: Secondary | ICD-10-CM | POA: Insufficient documentation

## 2016-08-10 DIAGNOSIS — R0789 Other chest pain: Secondary | ICD-10-CM | POA: Diagnosis present

## 2016-08-10 DIAGNOSIS — R0600 Dyspnea, unspecified: Secondary | ICD-10-CM

## 2016-08-10 DIAGNOSIS — Z79899 Other long term (current) drug therapy: Secondary | ICD-10-CM | POA: Diagnosis not present

## 2016-08-10 LAB — COMPREHENSIVE METABOLIC PANEL
ALK PHOS: 25 U/L — AB (ref 38–126)
ALT: 26 U/L (ref 14–54)
AST: 30 U/L (ref 15–41)
Albumin: 4.3 g/dL (ref 3.5–5.0)
Anion gap: 8 (ref 5–15)
BUN: 20 mg/dL (ref 6–20)
CALCIUM: 9.1 mg/dL (ref 8.9–10.3)
CO2: 25 mmol/L (ref 22–32)
CREATININE: 0.93 mg/dL (ref 0.44–1.00)
Chloride: 104 mmol/L (ref 101–111)
GFR calc Af Amer: >60 mL/min (ref >60)
Glucose, Bld: 107 mg/dL — ABNORMAL HIGH (ref 65–99)
Potassium: 3.6 mmol/L (ref 3.5–5.1)
SODIUM: 137 mmol/L (ref 135–145)
Total Bilirubin: 0.3 mg/dL (ref 0.3–1.2)
Total Protein: 7.9 g/dL (ref 6.5–8.1)

## 2016-08-10 LAB — CBC
HCT: 36.4 % (ref 35.0–47.0)
Hemoglobin: 12.4 g/dL (ref 12.0–16.0)
MCH: 30.7 pg (ref 26.0–34.0)
MCHC: 34.1 g/dL (ref 32.0–36.0)
MCV: 90.1 fL (ref 80.0–100.0)
PLATELETS: 348 10*3/uL (ref 150–440)
RBC: 4.03 MIL/uL (ref 3.80–5.20)
RDW: 13.2 % (ref 11.5–14.5)
WBC: 5.2 10*3/uL (ref 3.6–11.0)

## 2016-08-10 LAB — TROPONIN I: Troponin I: <0.03 ng/mL (ref <0.03)

## 2016-08-10 LAB — POCT PREGNANCY, URINE: PREG TEST UR: NEGATIVE

## 2016-08-10 MED ORDER — ALBUTEROL SULFATE (2.5 MG/3ML) 0.083% IN NEBU: 5.0000 mg | INHALATION_SOLUTION | Freq: Once | RESPIRATORY_TRACT | Status: DC

## 2016-08-10 NOTE — Discharge Instructions (Signed)
As we discussed, your workup today was reassuring.  Though we do not know exactly what is causing your symptoms, it appears that you have no emergent medical condition at this time and that you are safe to go home and follow up as recommended in this paperwork. ° °Please return immediately to the Emergency Department if you develop any new or worsening symptoms that concern you. ° °

## 2016-08-10 NOTE — ED Triage Notes (Signed)
Patient to ED via POV for shortness of breath and left arm numbness. Patient state that her symptoms started about 40 minutes ago and became worse about 20 minutes ago. Patient was dancing in a parade when her symptoms first started. Patient states that she also had chest pain that radiated down her arm and into the left side of her neck. Patient states that she feels like she is unable to get a deep breath.

## 2016-08-10 NOTE — ED Provider Notes (Signed)
[facility] Emergency Department Provider Note  ____________________________________________   First MD Initiated Contact with Patient 08/10/16 1216     (approximate)  I have reviewed the triage vital signs and the nursing notes.   HISTORY  Chief Complaint Shortness of Breath and Numbness    HPI Kathy Welch is a 30 y.o. female whose past medical history includes episodic migraines, seizure disorder, bipolar disorder, anxiety, and is currently seeing Dr. Raul Del given an abnormal finding on a recent CT scan of her lungs.  She presents today for evaluation of acute onset shortness of breath and left-sided chest discomfort that radiates into her left arm.  It came on acutely while she was performing and dancing in a parade.  Once the symptoms started she said that she became increasingly worried and anxious about them and they got worse to the point of becoming severe.  She felt like she could not take a deep breath and was having a great deal of discomfort in her chest and arm.  That is almost completely resolved although she states that her arm still feels like it is aching somewhat.  She denies nausea, vomiting, diaphoresis, abdominal pain, dysuria.  She states that nothing in particular made the symptoms better nor worse.  She has not felt anything like this in the past.   Past Medical History:  Diagnosis Date  . Anxiety   . Asthma    as a child  . Bipolar disorder ([place])   . Complication of anesthesia    nausea  . Seizures ([place])    "at age 70 after giving blood"  . UTI (lower urinary tract infection)     Patient Active Problem List   Diagnosis Date Noted  . Endometriosis determined by laparoscopy 01/22/2016    Past Surgical History:  Procedure Laterality Date  . back surgeries    . CHROMOPERTUBATION Bilateral 01/22/2016   Procedure: CHROMOPERTUBATION;  Surgeon: [name], MD;  Location: [facility] ORS;  Service: Gynecology;  Laterality:  Bilateral;  . CYSTOSCOPY N/A 01/22/2016   Procedure: CYSTOSCOPY;  Surgeon: [name], MD;  Location: [facility] ORS;  Service: Gynecology;  Laterality: N/A;  . KNEE ARTHROSCOPY Right   . LAPAROSCOPIC APPENDECTOMY  01/22/2016   Procedure: APPENDECTOMY LAPAROSCOPIC;  Surgeon: [name], MD;  Location: [facility] ORS;  Service: Gynecology;;  . LAPAROSCOPIC LYSIS OF ADHESIONS Bilateral 01/22/2016   Procedure: LAPAROSCOPIC LYSIS OF ADHESIONS;  Surgeon: [name], MD;  Location: [facility] ORS;  Service: Gynecology;  Laterality: Bilateral;  . LAPAROSCOPY    . LAPAROSCOPY N/A 01/22/2016   Procedure: LAPAROSCOPY DIAGNOSTIC/ pap smear;  Surgeon: [name], MD;  Location: [facility] ORS;  Service: Gynecology;  Laterality: N/A;  . LAPAROSCOPY N/A 01/22/2016   Procedure: LAPAROSCOPY OPERATIVE - excision of endometriosis;  Surgeon: [name], MD;  Location: [facility] ORS;  Service: Gynecology;  Laterality: N/A;  . SKIN GRAFT     lip    Prior to Admission medications   Medication Sig Start Date End Date Taking? Authorizing Provider  acetaminophen (TYLENOL) 500 MG tablet Take 2 tablets (1,000 mg total) by mouth every 8 (eight) hours. 01/23/16   [name], MD  docusate sodium (COLACE) 100 MG capsule Take 1 capsule (100 mg total) by mouth 2 (two) times daily. 01/23/16   [name], MD  gabapentin (NEURONTIN) 600 MG tablet Take 1 tablet (600 mg total) by mouth daily. 06/21/16 06/21/17  Earleen Newport, MD  ondansetron (ZOFRAN ODT) 4 MG disintegrating tablet Take 1 tablet (4  mg total) by mouth every 6 (six) hours as needed for nausea. 01/23/16   [name], MD  oxyCODONE (ROXICODONE) 5 MG immediate release tablet Take 1 tablet (5 mg total) by mouth every 4 (four) hours as needed for severe pain. 01/23/16   [name], MD  promethazine (PHENERGAN) 25 MG suppository Place 1 suppository (25 mg total) rectally every 6 (six) hours as needed for nausea or vomiting. 01/23/16   [name], MD    rizatriptan (MAXALT-MLT) 10 MG disintegrating tablet Take 1 tablet (10 mg total) by mouth as needed for migraine. May repeat in 2 hours if needed 06/21/16   Earleen Newport, MD    Allergies Sulfa antibiotics  No family history on file.  Social History Social History  Substance Use Topics  . Smoking status: Never Smoker  . Smokeless tobacco: Never Used  . Alcohol use Yes     Comment: occ    Review of Systems Constitutional: No fever/chills Eyes: No visual changes. ENT: No sore throat. Cardiovascular: +chest pain. Respiratory: +shortness of breath. Gastrointestinal: No abdominal pain.  No nausea, no vomiting.  No diarrhea.  No constipation. Genitourinary: Negative for dysuria. Musculoskeletal: Negative for back pain. Skin: Negative for rash. Neurological: Negative for headaches, focal weakness or numbness.  10-point ROS otherwise negative.  ____________________________________________   PHYSICAL EXAM:  VITAL SIGNS: ED Triage Vitals  Enc Vitals Group     BP 08/10/16 1133 131/82     Pulse Rate 08/10/16 1133 (!) 109     Resp 08/10/16 1133 18     Temp 08/10/16 1133 98.5 F (36.9 C)     Temp Source 08/10/16 1133 Oral     SpO2 08/10/16 1133 100 %     Weight 08/10/16 1123 150 lb (68 kg)     Height 08/10/16 1123 5\' 2"  (1.575 m)     Head Circumference --      Peak Flow --      Pain Score 08/10/16 1123 7     Pain Loc --      Pain Edu? --      Excl. in [place]? --     Constitutional: Alert and oriented. Well appearing and in no acute distress. Eyes: Conjunctivae are normal. PERRL. EOMI. Head: Atraumatic. Nose: No congestion/rhinnorhea. Mouth/Throat: Mucous membranes are moist.  Oropharynx non-erythematous. Neck: No stridor.  No meningeal signs.   Cardiovascular: Normal rate, regular rhythm. Good peripheral circulation. Grossly normal heart sounds. Respiratory: Normal respiratory effort.  No retractions. Lungs CTAB. Gastrointestinal: Soft and nontender. No  distention.  Musculoskeletal: No lower extremity tenderness nor edema. No gross deformities of extremities. Neurologic:  Normal speech and language. No gross focal neurologic deficits are appreciated.  Skin:  Skin is warm, dry and intact. No rash noted. Psychiatric: Mood and affect are normal. Speech and behavior are normal.  ____________________________________________   LABS (all labs ordered are listed, but only abnormal results are displayed)  Labs Reviewed  COMPREHENSIVE METABOLIC PANEL - Abnormal; Notable for the following:       Result Value   Glucose, Bld 107 (*)    Alkaline Phosphatase 25 (*)    All other components within normal limits  CBC  TROPONIN I  POCT PREGNANCY, URINE   ____________________________________________  EKG  ED ECG REPORT I, Hasini Peachey, the attending physician, personally viewed and interpreted this ECG.  Date: 08/10/2016 EKG Time: 11:22 Rate: 115 Rhythm: sinus tachycardia QRS Axis: normal Intervals: normal ST/T Wave abnormalities: normal Conduction Disturbances: none Narrative Interpretation: unremarkable  ____________________________________________  RADIOLOGY   Dg Chest 2 View  Result Date: 08/10/2016 CLINICAL DATA:  Shortness of breath and left arm numbness beginning 40 minutes ago. EXAM: CHEST  2 VIEW COMPARISON:  Chest CTA dated 07/26/2016. FINDINGS: Normal sized heart. Clear lungs with normal vascularity. Normal appearing bones. IMPRESSION: Normal examination. Electronically Signed   By: Claudie Revering M.D.   On: 08/10/2016 12:40    ____________________________________________   PROCEDURES  Procedure(s) performed:   Procedures   Critical Care performed: No ____________________________________________   INITIAL IMPRESSION / ASSESSMENT AND PLAN / ED COURSE  Pertinent labs & imaging results that were available during my care of the patient were reviewed by me and considered in my medical decision making (see chart for  details).  Her physical exam is reassuring at this time. She had an outpatient CTA chest about 2 weeks ago to rule out pulmonary embolism and that was when they found a small area concerning for possible "round pneumonia" which she was treated empirically and sees Dr. Raul Del.  This all occurred within the last week.  She is mildly tachycardic but I think this is more likely due to her anxiety over her symptoms.  She just had a CTA and she is young and otherwise healthy (Wells score for PE is 1.5) and I do not believe she would benefit from additional imaging to again rule out PE.  Her HEART score is at most 1 for "moderately suspicious" chest pain, she is 30 years old, active and generally healthy with no cardiac risk factors and a normal EKG and I find it incredibly unlikely that she is having ACS.  We will proceed with a normal lab workup and I provided reassurance and explained that I think most likely she overexerted herself and then had a panic attack which worsened all of her symptoms.  I will reassess after her lab work and chest x-ray results are available.  She is comfortable with the plan at this time and is in absolutely no distress, interacting appropriately and joking with me, and has a normal physical exam.   Clinical Course as of Aug 10 1425  Sat Aug 10, 2016  1423 The patient feels better at this time.  She still has a little bit of residual tingling in her left arm but she has been in no distress and her exam room, texting without difficulty and talking to her boyfriend without any issues.  She continues to have clear breath sounds throughout even though she states subjectively she feels a little bit short of breath.  She has not been less than 100% on room air at all times.  When she got up to move around she became slightly tachycardic at about 110 curvature to drink plenty of fluids, but again there is no indication that she is having ACS and with her recent negative CT scan ruling out  pulmonary embolism I think that it would be doing her a disservice to reimage her given her young age and no risk factors.  Encouraged her to follow up with Dr. Raul Del on Monday with whom she has already established care.  [CF]  1424 I gave my usual and customary return precautions. She understands and agrees with the plan.  [CF]    Clinical Course User Index [CF] Hinda Kehr, MD    ____________________________________________  FINAL CLINICAL IMPRESSION(S) / ED DIAGNOSES  Final diagnoses:  Atypical chest pain  Dyspnea, unspecified type     MEDICATIONS GIVEN DURING THIS VISIT:  Medications -  No data to display   NEW OUTPATIENT MEDICATIONS STARTED DURING THIS VISIT:  New Prescriptions   No medications on file    Modified Medications   No medications on file    Discontinued Medications   No medications on file     Note:  This document was prepared using Dragon voice recognition software and may include unintentional dictation errors.    Hinda Kehr, MD 08/10/16 1426

## 2016-08-14 ENCOUNTER — Encounter: Payer: BLUE CROSS/BLUE SHIELD | Admitting: Physical Therapy

## 2017-02-13 ENCOUNTER — Encounter (HOSPITAL_COMMUNITY): Payer: Self-pay | Admitting: Emergency Medicine

## 2017-02-13 ENCOUNTER — Emergency Department (HOSPITAL_COMMUNITY)
Admission: EM | Admit: 2017-02-13 | Discharge: 2017-02-13 | Disposition: A | Discharge status: Home / Self Care | Payer: BLUE CROSS/BLUE SHIELD | Attending: Emergency Medicine | Admitting: Emergency Medicine

## 2017-02-13 DIAGNOSIS — J45909 Unspecified asthma, uncomplicated: Secondary | ICD-10-CM | POA: Insufficient documentation

## 2017-02-13 DIAGNOSIS — Z79899 Other long term (current) drug therapy: Secondary | ICD-10-CM | POA: Insufficient documentation

## 2017-02-13 DIAGNOSIS — K529 Noninfective gastroenteritis and colitis, unspecified: Secondary | ICD-10-CM

## 2017-02-13 DIAGNOSIS — R1110 Vomiting, unspecified: Secondary | ICD-10-CM | POA: Diagnosis not present

## 2017-02-13 DIAGNOSIS — N898 Other specified noninflammatory disorders of vagina: Secondary | ICD-10-CM

## 2017-02-13 DIAGNOSIS — R079 Chest pain, unspecified: Secondary | ICD-10-CM | POA: Diagnosis present

## 2017-02-13 LAB — CBC
HEMATOCRIT: 39.4 % (ref 36.0–46.0)
Hemoglobin: 12.9 g/dL (ref 12.0–15.0)
MCH: 29.3 pg (ref 26.0–34.0)
MCHC: 32.7 g/dL (ref 30.0–36.0)
MCV: 89.5 fL (ref 78.0–100.0)
Platelets: 361 10*3/uL (ref 150–400)
RBC: 4.4 MIL/uL (ref 3.87–5.11)
RDW: 13.3 % (ref 11.5–15.5)
WBC: 6.5 10*3/uL (ref 4.0–10.5)

## 2017-02-13 LAB — COMPREHENSIVE METABOLIC PANEL
ALBUMIN: 4.3 g/dL (ref 3.5–5.0)
ALT: 37 U/L (ref 14–54)
AST: 31 U/L (ref 15–41)
Alkaline Phosphatase: 41 U/L (ref 38–126)
Anion gap: 10 (ref 5–15)
BUN: 10 mg/dL (ref 6–20)
CHLORIDE: 102 mmol/L (ref 101–111)
CO2: 23 mmol/L (ref 22–32)
CREATININE: 0.9 mg/dL (ref 0.44–1.00)
Calcium: 9.7 mg/dL (ref 8.9–10.3)
GFR calc Af Amer: >60 mL/min (ref >60)
GFR calc non Af Amer: >60 mL/min (ref >60)
GLUCOSE: 121 mg/dL — AB (ref 65–99)
POTASSIUM: 4.6 mmol/L (ref 3.5–5.1)
Sodium: 135 mmol/L (ref 135–145)
Total Bilirubin: 1 mg/dL (ref 0.3–1.2)
Total Protein: 7.5 g/dL (ref 6.5–8.1)

## 2017-02-13 LAB — WET PREP, GENITAL
Clue Cells Wet Prep HPF POC: NONE SEEN
SPERM: NONE SEEN
Trich, Wet Prep: NONE SEEN
YEAST WET PREP: NONE SEEN

## 2017-02-13 LAB — URINALYSIS, ROUTINE W REFLEX MICROSCOPIC
Bilirubin Urine: NEGATIVE
GLUCOSE, UA: NEGATIVE mg/dL
HGB URINE DIPSTICK: NEGATIVE
KETONES UR: NEGATIVE mg/dL
LEUKOCYTES UA: NEGATIVE
Nitrite: NEGATIVE
PH: 7 (ref 5.0–8.0)
Protein, ur: NEGATIVE mg/dL
Specific Gravity, Urine: 1.019 (ref 1.005–1.030)

## 2017-02-13 LAB — I-STAT TROPONIN, ED: Troponin i, poc: 0 ng/mL (ref 0.00–0.08)

## 2017-02-13 LAB — LIPASE, BLOOD: LIPASE: 23 U/L (ref 11–51)

## 2017-02-13 MED ORDER — KETOROLAC TROMETHAMINE 30 MG/ML IJ SOLN
30.0000 mg | Freq: Once | INTRAMUSCULAR | Status: AC
  Administered 2017-02-13: 30 mg via INTRAVENOUS
  Filled 2017-02-13: qty 1

## 2017-02-13 MED ORDER — ONDANSETRON 4 MG PO TBDP: 4.0000 mg | ORAL_TABLET | Freq: Three times a day (TID) | ORAL | 0 refills | Status: DC | PRN

## 2017-02-13 MED ORDER — LORAZEPAM 2 MG/ML IJ SOLN
0.5000 mg | Freq: Once | INTRAMUSCULAR | Status: AC
  Administered 2017-02-13: 0.5 mg via INTRAVENOUS
  Filled 2017-02-13: qty 1

## 2017-02-13 MED ORDER — SODIUM CHLORIDE 0.9 % IV BOLUS (SEPSIS)
1000.0000 mL | Freq: Once | INTRAVENOUS | Status: AC
  Administered 2017-02-13: 1000 mL via INTRAVENOUS

## 2017-02-13 MED ORDER — ONDANSETRON 4 MG PO TBDP
4.0000 mg | ORAL_TABLET | Freq: Once | ORAL | Status: AC
  Administered 2017-02-13: 4 mg via ORAL
  Filled 2017-02-13: qty 1

## 2017-02-13 NOTE — Discharge Instructions (Signed)
You were evaluated for abdominal pain with associated nausea and vomiting.  You had normal EKG and labs.  There was no evidence of infection on your exam.  There was no evidence of acute pancreatitis.  You had vaginal swabs collected during today's visit.  If anything comes back abnormal, you will be contacted at the number you have provided.  Plan to follow up with your PCP in the next week.

## 2017-02-13 NOTE — ED Triage Notes (Signed)
Reports "rib pain" chest pain radiating to back and vomiting/diarrhea since working yesetrday.

## 2017-02-13 NOTE — ED Provider Notes (Signed)
[place] DEPT Provider Note   CSN: [id number] Arrival date & time: 02/13/17  0640     History   Chief Complaint Chief Complaint  Patient presents with  . Chest Pain  . Emesis  . Diarrhea    HPI Kathy Welch is a 31 y.o. female.  Patient reports that around 4pm yesterday, she started having chest/ rib pain that has now localized to the left side.  She reports associated vomiting and diarrhea.  She denies hematochezia, melena.  She has a h/o rectal bleeding related to a fissure but no other GI pathology.  She reports she took Rolaids and Prevacid with no improvement in symptoms.  She has not been able to keep anything down.  She denies fevers, abnormal vaginal discharge.  She reports dysuria that has been chronic for her.  This only happens with 1st void of the day.  She has a h/o endometriosis which has attached to her bladder.  She notes that her gynecologist has told her she has interstitial cystitis.  No recent travel, undercooked foods or foods left out too long, petting zoos.  Takes Neurontin 615m BID, Lexapro, Xanax, Singulair. Maxalt.  No ETOH, Tob, drug use.  She reports she resides with her boyfriend.     Past Medical History:  Diagnosis Date  . Anxiety   . Asthma    as a child  . [place]   . Complication of anesthesia    nausea  . [place]ures ([place]    "at age 89after giving blood"  . UTI (lower urinary tract infection)     Patient Active Problem List   Diagnosis Date Noted  . Endometriosis determined by laparoscopy 01/22/2016    Past Surgical History:  Procedure Laterality Date  . back surgeries    . CHROMOPERTUBATION Bilateral 01/22/2016   Procedure: CHROMOPERTUBATION;  Surgeon: BBenjaman Kindler MD;  Location: [facility] ORS;  Service: Gynecology;  Laterality: Bilateral;  . CYSTOSCOPY N/A 01/22/2016   Procedure: CYSTOSCOPY;  Surgeon: BBenjaman Kindler MD;  Location: [facility] ORS;  Service: Gynecology;  Laterality: N/A;  . KNEE ARTHROSCOPY  Right   . LAPAROSCOPIC APPENDECTOMY  01/22/2016   Procedure: APPENDECTOMY LAPAROSCOPIC;  Surgeon: BBenjaman Kindler MD;  Location: [facility] ORS;  Service: Gynecology;;  . LAPAROSCOPIC LYSIS OF ADHESIONS Bilateral 01/22/2016   Procedure: LAPAROSCOPIC LYSIS OF ADHESIONS;  Surgeon: BBenjaman Kindler MD;  Location: [facility] ORS;  Service: Gynecology;  Laterality: Bilateral;  . LAPAROSCOPY    . LAPAROSCOPY N/A 01/22/2016   Procedure: LAPAROSCOPY DIAGNOSTIC/ pap smear;  Surgeon: BBenjaman Kindler MD;  Location: [facility] ORS;  Service: Gynecology;  Laterality: N/A;  . LAPAROSCOPY N/A 01/22/2016   Procedure: LAPAROSCOPY OPERATIVE - excision of endometriosis;  Surgeon: BBenjaman Kindler MD;  Location: [facility] ORS;  Service: Gynecology;  Laterality: N/A;  . SKIN GRAFT     lip    OB History    No data available      Home Medications    Prior to Admission medications   Medication Sig Start Date End Date Taking? Authorizing Provider  acetaminophen (TYLENOL) 500 MG tablet Take 2 tablets (1,000 mg total) by mouth every 8 (eight) hours. 01/23/16   BBenjaman Kindler MD  docusate sodium (COLACE) 100 MG capsule Take 1 capsule (100 mg total) by mouth 2 (two) times daily. 01/23/16   BBenjaman Kindler MD  gabapentin (NEURONTIN) 600 MG tablet Take 1 tablet (600 mg total) by mouth daily. 06/21/16 06/21/17  WEarleen Newport MD  ondansetron (ZOFRAN ODT) 4 MG disintegrating tablet  Take 1 tablet (4 mg total) by mouth every 8 (eight) hours as needed for nausea or vomiting. 02/13/17   Janora Norlander, DO  oxyCODONE (ROXICODONE) 5 MG immediate release tablet Take 1 tablet (5 mg total) by mouth every 4 (four) hours as needed for severe pain. 01/23/16   [name], MD  rizatriptan (MAXALT-MLT) 10 MG disintegrating tablet Take 1 tablet (10 mg total) by mouth as needed for migraine. May repeat in 2 hours if needed 06/21/16   Earleen Newport, MD    Family History History reviewed. No pertinent family history.  Social  History Social History  Substance Use Topics  . Smoking status: Never Smoker  . Smokeless tobacco: Never Used  . Alcohol use Yes     Comment: occ     Allergies   Sulfa antibiotics   Review of Systems Review of Systems  Constitutional: Negative for activity change, chills, diaphoresis and fever.  Respiratory: Negative for cough, chest tightness and shortness of breath.   Cardiovascular: Positive for chest pain (rib). Negative for palpitations.  Gastrointestinal: Positive for abdominal pain, diarrhea, nausea and vomiting. Negative for anal bleeding, blood in stool and rectal pain.  Genitourinary: Positive for dysuria (chronic). Negative for dyspareunia, flank pain, frequency, hematuria, urgency and vaginal discharge.  Musculoskeletal: Positive for back pain.  Neurological: Negative for dizziness and weakness.     Physical Exam Updated Vital Signs BP (!) 130/106   Pulse 93   Temp 97.7 F (36.5 C) (Oral)   Resp 14   Ht _0  (1.575 m)   Wt 75.8 kg (167 lb 3 oz)   SpO2 100%   BMI 30.58 kg/m   Physical Exam  Constitutional: She is oriented to person, place, and time. She appears well-developed and well-nourished. No distress (but looks uncomfortable).  HENT:  Head: Normocephalic and atraumatic.  Eyes: Conjunctivae and EOM are normal. Pupils are equal, round, and reactive to light. No scleral icterus.  Neck: Normal range of motion. Neck supple.  Cardiovascular: Normal rate, regular rhythm, normal heart sounds and intact distal pulses.   No murmur heard. Pulmonary/Chest: Effort normal and breath sounds normal. No respiratory distress.  Abdominal: Soft. Bowel sounds are normal. She exhibits no distension and no mass. There is tenderness (epigastric, RUQ and RLQ). There is no rebound and no guarding.  Genitourinary: Pelvic exam was performed with patient supine. There is no lesion on the right labia. There is no lesion on the left labia. Uterus is not enlarged. Cervix exhibits  discharge (yellow white). Cervix exhibits no motion tenderness (generalized tenderness to entire vagina on SSE but negative chandalier sign) and no friability. Right adnexum displays tenderness. Right adnexum displays no mass and no fullness. Left adnexum displays no mass, no tenderness and no fullness. There is tenderness (during entire pelvic exam.) in the vagina. No erythema or bleeding in the vagina.  Musculoskeletal: Normal range of motion. She exhibits no edema.  Neurological: She is alert and oriented to person, place, and time.  Skin: Skin is warm and dry. Capillary refill takes less than 2 seconds. No rash noted. She is not diaphoretic.  Psychiatric: She has a normal mood and affect. Her behavior is normal. Judgment and thought content normal.   ED Treatments / Results  Labs (all labs ordered are listed, but only abnormal results are displayed) Labs Reviewed  WET PREP, GENITAL - Abnormal; Notable for the following:       Result Value   WBC, Wet Prep HPF POC MANY (*)  All other components within normal limits  COMPREHENSIVE METABOLIC PANEL - Abnormal; Notable for the following:    Glucose, Bld 121 (*)    All other components within normal limits  URINALYSIS, ROUTINE W REFLEX MICROSCOPIC - Abnormal; Notable for the following:    APPearance HAZY (*)    All other components within normal limits  LIPASE, BLOOD  CBC  POC URINE PREG, ED  I-STAT TROPOININ, ED  GC/CHLAMYDIA PROBE AMP ([place]) NOT AT [facility]    EKG  EKG Interpretation None       Radiology No results found.  Procedures Procedures (including critical care time)  Medications Ordered in ED Medications  ondansetron (ZOFRAN-ODT) disintegrating tablet 4 mg (4 mg Oral Given 02/13/17 0807)  sodium chloride 0.9 % bolus 1,000 mL (1,000 mLs Intravenous New Bag/Given 02/13/17 0810)  ketorolac (TORADOL) 30 MG/ML injection 30 mg (30 mg Intravenous Given 02/13/17 0807)  LORazepam (ATIVAN) injection 0.5 mg (0.5 mg  Intravenous Given 02/13/17 1610)    Initial Impression / Assessment and Plan / ED Course  I have reviewed the triage vital signs and the nursing notes.  Pertinent labs & imaging results that were available during my care of the patient were reviewed by me and considered in my medical decision making (see chart for details).    9604: Labs unremarkable.  Lipase negative. Upreg negative so doubt ectopic pregnancy.  STI kit ordered, will check pelvic for evidence of PID.  0824: UA negative for evidence of infection.  Ativan 0.72m IV administered once for anxiety related to pelvic exam.  05409 Generalized discomfort with pelvic exam.  No lesions seen.  Scant yellow white discharge but no bleeding or odors.    08119 Wet prep with many white cells.  Otherwise negative.  Final Clinical Impressions(s) / ED Diagnoses   Final diagnoses:  Gastroenteritis   Stanley N Salone is a 31y.o. female that presented with abdominal pain, nausea, vomiting and diarrhea.  Workup was negative for evidence of pancreatitis, PID, ectopic pregnancy.  Wet prep with leukocytes but otherwise unremarkable.  Symptoms are likely related to  a gastroenteritis.  She responded well to IVFs, oral Zofran and IV toradol.  Home care instructions reviewed.  She was discharged in stable condition with home Rx for Zofran.  We discussed consideration for use of Imodium, however, did note that this may prolong her course.  She voiced good understanding of discharge instructions and will schedule an appt to see her PCP in the next week if needed.  New Prescriptions New Prescriptions   ONDANSETRON (ZOFRAN ODT) 4 MG DISINTEGRATING TABLET    Take 1 tablet (4 mg total) by mouth every 8 (eight) hours as needed for nausea or vomiting.     GRonnie DossMDeerfield DO 02/13/17 01478   PCharlesetta Shanks MD 02/17/17 [phone]

## 2017-02-14 LAB — GC/CHLAMYDIA PROBE AMP (~~LOC~~) NOT AT ARMC
Chlamydia: NEGATIVE
Neisseria Gonorrhea: NEGATIVE

## 2018-04-21 ENCOUNTER — Emergency Department (HOSPITAL_COMMUNITY): Payer: BLUE CROSS/BLUE SHIELD

## 2018-04-21 ENCOUNTER — Encounter (HOSPITAL_COMMUNITY): Payer: Self-pay | Admitting: Emergency Medicine

## 2018-04-21 ENCOUNTER — Other Ambulatory Visit: Payer: Self-pay

## 2018-04-21 ENCOUNTER — Emergency Department (HOSPITAL_COMMUNITY)
Admission: EM | Admit: 2018-04-21 | Discharge: 2018-04-21 | Disposition: A | Discharge status: Home / Self Care | Payer: BLUE CROSS/BLUE SHIELD | Attending: Emergency Medicine | Admitting: Emergency Medicine

## 2018-04-21 DIAGNOSIS — J45909 Unspecified asthma, uncomplicated: Secondary | ICD-10-CM | POA: Diagnosis not present

## 2018-04-21 DIAGNOSIS — J181 Lobar pneumonia, unspecified organism: Secondary | ICD-10-CM

## 2018-04-21 DIAGNOSIS — F419 Anxiety disorder, unspecified: Secondary | ICD-10-CM | POA: Diagnosis not present

## 2018-04-21 DIAGNOSIS — R1012 Left upper quadrant pain: Secondary | ICD-10-CM | POA: Diagnosis present

## 2018-04-21 DIAGNOSIS — J189 Pneumonia, unspecified organism: Secondary | ICD-10-CM | POA: Insufficient documentation

## 2018-04-21 DIAGNOSIS — F319 Bipolar disorder, unspecified: Secondary | ICD-10-CM | POA: Diagnosis not present

## 2018-04-21 LAB — LIPASE, BLOOD: Lipase: 36 U/L (ref 11–51)

## 2018-04-21 LAB — CBC
HCT: 38.3 % (ref 36.0–46.0)
Hemoglobin: 12.2 g/dL (ref 12.0–15.0)
MCH: 29.4 pg (ref 26.0–34.0)
MCHC: 31.9 g/dL (ref 30.0–36.0)
MCV: 92.3 fL (ref 78.0–100.0)
PLATELETS: 432 10*3/uL — AB (ref 150–400)
RBC: 4.15 MIL/uL (ref 3.87–5.11)
RDW: 13.1 % (ref 11.5–15.5)
WBC: 7 10*3/uL (ref 4.0–10.5)

## 2018-04-21 LAB — COMPREHENSIVE METABOLIC PANEL
ALBUMIN: 4.3 g/dL (ref 3.5–5.0)
ALK PHOS: 40 U/L (ref 38–126)
ALT: 35 U/L (ref 0–44)
AST: 26 U/L (ref 15–41)
Anion gap: 9 (ref 5–15)
BILIRUBIN TOTAL: 0.5 mg/dL (ref 0.3–1.2)
BUN: 8 mg/dL (ref 6–20)
CALCIUM: 9.2 mg/dL (ref 8.9–10.3)
CO2: 27 mmol/L (ref 22–32)
CREATININE: 0.98 mg/dL (ref 0.44–1.00)
Chloride: 101 mmol/L (ref 98–111)
GFR calc Af Amer: >60 mL/min (ref >60)
GLUCOSE: 73 mg/dL (ref 70–99)
Potassium: 3.9 mmol/L (ref 3.5–5.1)
Sodium: 137 mmol/L (ref 135–145)
Total Protein: 7.3 g/dL (ref 6.5–8.1)

## 2018-04-21 LAB — I-STAT BETA HCG BLOOD, ED (MC, WL, AP ONLY)

## 2018-04-21 MED ORDER — AZITHROMYCIN 250 MG PO TABS: 250.0000 mg | ORAL_TABLET | Freq: Every day | ORAL | 0 refills | Status: DC

## 2018-04-21 MED ORDER — TRAMADOL HCL 50 MG PO TABS: 50.0000 mg | ORAL_TABLET | Freq: Four times a day (QID) | ORAL | 0 refills | Status: DC | PRN

## 2018-04-21 MED ORDER — AZITHROMYCIN 250 MG PO TABS
500.0000 mg | ORAL_TABLET | Freq: Once | ORAL | Status: AC
Start: 2018-04-21 — End: 2018-04-21
  Administered 2018-04-21: 500 mg via ORAL
  Filled 2018-04-21: qty 2

## 2018-04-21 MED ORDER — TRAMADOL HCL 50 MG PO TABS
50.0000 mg | ORAL_TABLET | Freq: Once | ORAL | Status: AC
  Administered 2018-04-21: 50 mg via ORAL
  Filled 2018-04-21: qty 1

## 2018-04-21 MED ORDER — IOHEXOL 300 MG/ML  SOLN
100.0000 mL | Freq: Once | INTRAMUSCULAR | Status: AC | PRN
  Administered 2018-04-21: 100 mL via INTRAVENOUS

## 2018-04-21 NOTE — ED Provider Notes (Signed)
[facility] EMERGENCY DEPARTMENT Provider Note   CSN: [id number] Arrival date & time: 04/21/18  1732     History   Chief Complaint Chief Complaint  Patient presents with  . Abdominal Pain    HPI Kathy Welch is a 32 y.o. female.  The history is provided by the patient and medical records.  Abdominal Pain   Associated symptoms include nausea and vomiting.     32 y.o. F with hx of anxiety, asthma, bipolar disorder, seizures, hx of UTI, presenting to the ED for left sided abdominal pain.  States she went to urgent care earlier today for same, had a UA that was negative and was sent here with concern for possible diverticulitis.  States pain throughout left side of the abdomen, mostly upper and some into the left back.  She denies any nausea, vomiting, diarrhea.  No fever/chills.  No pelvic pain or vaginal discharge.  States she has had a slight cough and some sneezing lately.  No chest pain or SOB. 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  No meds tried PTA.  Past Medical History:  Diagnosis Date  . Anxiety   . Asthma    as a child  . Bipolar disorder ([place])   . Complication of anesthesia    nausea  . Seizures ([place])    "at age 76 after giving blood"  . UTI (lower urinary tract infection)     Patient Active Problem List   Diagnosis Date Noted  . Endometriosis determined by laparoscopy 01/22/2016    Past Surgical History:  Procedure Laterality Date  . back surgeries    . CHROMOPERTUBATION Bilateral 01/22/2016   Procedure: CHROMOPERTUBATION;  Surgeon: [name], MD;  Location: [facility] ORS;  Service: Gynecology;  Laterality: Bilateral;  . CYSTOSCOPY N/A 01/22/2016   Procedure: CYSTOSCOPY;  Surgeon: [name], MD;  Location: [facility] ORS;  Service: Gynecology;  Laterality: N/A;  . KNEE ARTHROSCOPY Right   . LAPAROSCOPIC APPENDECTOMY  01/22/2016   Procedure: APPENDECTOMY  LAPAROSCOPIC;  Surgeon: [name], MD;  Location: [facility] ORS;  Service: Gynecology;;  . LAPAROSCOPIC LYSIS OF ADHESIONS Bilateral 01/22/2016   Procedure: LAPAROSCOPIC LYSIS OF ADHESIONS;  Surgeon: [name], MD;  Location: [facility] ORS;  Service: Gynecology;  Laterality: Bilateral;  . LAPAROSCOPY    . LAPAROSCOPY N/A 01/22/2016   Procedure: LAPAROSCOPY DIAGNOSTIC/ pap smear;  Surgeon: [name], MD;  Location: [facility] ORS;  Service: Gynecology;  Laterality: N/A;  . LAPAROSCOPY N/A 01/22/2016   Procedure: LAPAROSCOPY OPERATIVE - excision of endometriosis;  Surgeon: [name], MD;  Location: [facility] ORS;  Service: Gynecology;  Laterality: N/A;  . SKIN GRAFT     lip     OB History   None      Home Medications    Prior to Admission medications   Medication Sig Start Date End Date Taking? Authorizing Provider  gabapentin (NEURONTIN) 600 MG tablet Take 1 tablet (600 mg total) by mouth daily. Patient taking differently: Take 600 mg by mouth 2 (two) times daily.  06/21/16 04/21/26 Yes Earleen Newport, MD  acetaminophen (TYLENOL) 500 MG tablet Take 2 tablets (1,000 mg total) by mouth every 8 (eight) hours. 01/23/16   [name], MD  docusate sodium (COLACE) 100 MG capsule Take 1 capsule (100 mg total) by mouth 2 (two) times daily. 01/23/16   [name], MD  ondansetron (ZOFRAN ODT) 4 MG disintegrating tablet Take 1 tablet (  4 mg total) by mouth every 8 (eight) hours as needed for nausea or vomiting. 02/13/17   Janora Norlander, DO  oxyCODONE (ROXICODONE) 5 MG immediate release tablet Take 1 tablet (5 mg total) by mouth every 4 (four) hours as needed for severe pain. 01/23/16   [name], MD  rizatriptan (MAXALT-MLT) 10 MG disintegrating tablet Take 1 tablet (10 mg total) by mouth as needed for migraine. May repeat in 2 hours if needed 06/21/16   Earleen Newport, MD    Family History No family history on file.  Social History Social History   Tobacco Use    . Smoking status: Never Smoker  . Smokeless tobacco: Never Used  Substance Use Topics  . Alcohol use: Yes    Comment: occ  . Drug use: No     Allergies   Sulfa antibiotics   Review of Systems Review of Systems  Gastrointestinal: Positive for abdominal pain, nausea and vomiting.  All other systems reviewed and are negative.    Physical Exam Updated Vital Signs BP 109/75   Pulse 75   Temp 98.4 F (36.9 C) (Oral)   Resp 15   Ht 5\' 2"  (1.575 m)   Wt 72.6 kg   SpO2 100%   BMI 29.26 kg/m   Physical Exam  Constitutional: She is oriented to person, place, and time. She appears well-developed and well-nourished.  HENT:  Head: Normocephalic and atraumatic.  Mouth/Throat: Oropharynx is clear and moist.  Eyes: Pupils are equal, round, and reactive to light. Conjunctivae and EOM are normal.  Neck: Normal range of motion.  Cardiovascular: Normal rate, regular rhythm and normal heart sounds.  Pulmonary/Chest: Effort normal and breath sounds normal. She has no wheezes. She has no rhonchi.      Tenderness of left lower posterior ribs, no deformities or bruising  Abdominal: Soft. Bowel sounds are normal. There is tenderness. There is no rigidity, no guarding and no CVA tenderness.    Mild tenderness left upper but seems more localized to chest; normal bowel sounds, no distention  Musculoskeletal: Normal range of motion.  Neurological: She is alert and oriented to person, place, and time.  Skin: Skin is warm and dry.  Psychiatric: She has a normal mood and affect.  Nursing note and vitals reviewed.    ED Treatments / Results  Labs (all labs ordered are listed, but only abnormal results are displayed) Labs Reviewed  CBC - Abnormal; Notable for the following components:      Result Value   Platelets 432 (*)    All other components within normal limits  LIPASE, BLOOD  COMPREHENSIVE METABOLIC PANEL  I-STAT BETA HCG BLOOD, ED ([facility], [facility], [facility] ONLY)     EKG None  Radiology Ct Abdomen Pelvis W Contrast  Result Date: 04/21/2018 CLINICAL DATA:  Lower abdominal pain left-sided EXAM: CT ABDOMEN AND PELVIS WITH CONTRAST TECHNIQUE: Multidetector CT imaging of the abdomen and pelvis was performed using the standard protocol following bolus administration of intravenous contrast. CONTRAST:  143mL OMNIPAQUE IOHEXOL 300 MG/ML  SOLN COMPARISON:  None. FINDINGS: Lower chest: Ground-glass density in the left lower lobe. Hepatobiliary: No focal liver abnormality is seen. No gallstones, gallbladder wall thickening, or biliary dilatation. Pancreas: Unremarkable. No pancreatic ductal dilatation or surrounding inflammatory changes. Spleen: Normal in size without focal abnormality. Adrenals/Urinary Tract: Adrenal glands are unremarkable. Kidneys are normal, without renal calculi, focal lesion, or hydronephrosis. Bladder is unremarkable. Stomach/Bowel: Stomach is within normal limits. Status post appendectomy. No evidence of bowel wall  thickening, distention, or inflammatory changes. Vascular/Lymphatic: No significant vascular findings are present. No enlarged abdominal or pelvic lymph nodes. Reproductive: Uterus and bilateral adnexa are unremarkable. Other: Negative for free air or free fluid. Musculoskeletal: Posterior fixating screws and rods L5-S1 with interbody device. Additional anterior to posterior fixating screws at L5-S1. IMPRESSION: 1. Ground-glass density within the posterior left lower lobe is suspicious for a pneumonia. 2. No CT evidence for acute intra-abdominal or pelvic abnormality. Electronically Signed   By: Donavan Foil M.D.   On: 04/21/2018 21:09    Procedures Procedures (including critical care time)  Medications Ordered in ED Medications  azithromycin (ZITHROMAX) tablet 500 mg (has no administration in time range)  traMADol (ULTRAM) tablet 50 mg (has no administration in time range)  iohexol (OMNIPAQUE) 300 MG/ML solution 100 mL (100 mLs  Intravenous Contrast Given 04/21/18 2046)     Initial Impression / Assessment and Plan / ED Course  I have reviewed the triage vital signs and the nursing notes.  Pertinent labs & imaging results that were available during my care of the patient were reviewed by me and considered in my medical decision making (see chart for details).  32 y.o. F here with left sided abdominal pain.  Sent from [facility] for [facility] due to concern for possible diverticulitis.  Patient is afebrile, non-toxic.  On my exam, she does have some tenderness of the left upper abdomen but seems more so left lower chest wall and left lower posterior ribs.  No deformities.  Lungs overall clear.  Labs reassuring.  CT scan reviewed-- does have findings of LLL infiltrate, no other acute findings.  She does admit to cough and sneezing recently with likely possible sick contacts.  She is not tachycardic or hypoxic, BP stable.  She has no significant RF for DVT/PE and no history of such so I do not feel this represents PE with pulmonary infarct.  She will be treated with course of azithromycin, tramadol for pain.  Close follow-up with PCP.  Work note given.  She will return here for any new/acute changes.  Final Clinical Impressions(s) / ED Diagnoses   Final diagnoses:  Community acquired pneumonia of left lower lobe of lung [facility])    ED Discharge Orders         Ordered    azithromycin (ZITHROMAX) 250 MG tablet  Daily     04/21/18 2305    traMADol (ULTRAM) 50 MG tablet  Every 6 hours PRN     04/21/18 2305           Larene Pickett, PA-C 04/21/18 2339    Milton Ferguson, MD 04/23/18 1236

## 2018-04-21 NOTE — ED Notes (Signed)
Signature pad unavailable at time of pt discharge. Pt verbalized understanding of d/c instructions.Pt denied any further requests.  

## 2018-04-21 NOTE — ED Notes (Signed)
ED Provider at bedside. 

## 2018-04-21 NOTE — ED Notes (Signed)
Signature pad unavailable.  

## 2018-04-21 NOTE — Discharge Instructions (Signed)
Take the prescribed medication as directed. °Follow-up with your primary care doctor. °Return to the ED for new or worsening symptoms. °

## 2018-04-21 NOTE — ED Triage Notes (Signed)
Pt from [place]. For CT for possible diverticulitis. Pt reports worsening 10/10 left sided abd pain and distention that started Sunday. Denies N/V/D. Pt unable to stand up straight d/t the pain.

## 2018-04-21 NOTE — ED Provider Notes (Signed)
Patient placed in Quick Look pathway, seen and evaluated   Chief Complaint: abdominal pain HPI:  left lower abdominal pain.  Pt reports she was seen at Urgent care and told she could have diverticulitis    ROS:  Physical Exam:   Gen: No distress  Neuro: Awake and Alert  Skin: Warm    Focused Exam: tender left sided abdomen   Initiation of care has begun. The patient has been counseled on the process, plan, and necessity for staying for the completion/evaluation, and the remainder of the medical screening examination    Kathy Welch 04/21/18 1833    Kathy Morrison, MD 04/27/18 [phone]

## 2018-04-30 ENCOUNTER — Encounter: Payer: Self-pay | Admitting: *Deleted

## 2018-04-30 ENCOUNTER — Emergency Department: Payer: BLUE CROSS/BLUE SHIELD

## 2018-04-30 ENCOUNTER — Other Ambulatory Visit: Payer: Self-pay

## 2018-04-30 ENCOUNTER — Emergency Department
Admission: EM | Admit: 2018-04-30 | Discharge: 2018-04-30 | Disposition: A | Discharge status: Home / Self Care | Payer: BLUE CROSS/BLUE SHIELD | Attending: Student in an Organized Health Care Education/Training Program | Admitting: Student in an Organized Health Care Education/Training Program

## 2018-04-30 DIAGNOSIS — R05 Cough: Secondary | ICD-10-CM | POA: Insufficient documentation

## 2018-04-30 DIAGNOSIS — Z79899 Other long term (current) drug therapy: Secondary | ICD-10-CM | POA: Insufficient documentation

## 2018-04-30 DIAGNOSIS — Z77120 Contact with and (suspected) exposure to mold (toxic): Secondary | ICD-10-CM | POA: Diagnosis not present

## 2018-04-30 DIAGNOSIS — R059 Cough, unspecified: Secondary | ICD-10-CM

## 2018-04-30 LAB — CBC WITH DIFFERENTIAL/PLATELET
BASOS ABS: 0.1 10*3/uL (ref 0–0.1)
BASOS PCT: 1 %
Eosinophils Absolute: 0.4 10*3/uL (ref 0–0.7)
Eosinophils Relative: 5 %
HCT: 35.6 % (ref 35.0–47.0)
HEMOGLOBIN: 11.9 g/dL — AB (ref 12.0–16.0)
Lymphocytes Relative: 34 %
Lymphs Abs: 2.6 10*3/uL (ref 1.0–3.6)
MCH: 30.1 pg (ref 26.0–34.0)
MCHC: 33.5 g/dL (ref 32.0–36.0)
MCV: 89.7 fL (ref 80.0–100.0)
MONOS PCT: 10 %
Monocytes Absolute: 0.8 10*3/uL (ref 0.2–0.9)
NEUTROS ABS: 3.8 10*3/uL (ref 1.4–6.5)
NEUTROS PCT: 50 %
Platelets: 397 10*3/uL (ref 150–440)
RBC: 3.97 MIL/uL (ref 3.80–5.20)
RDW: 13.3 % (ref 11.5–14.5)
WBC: 7.7 10*3/uL (ref 3.6–11.0)

## 2018-04-30 LAB — COMPREHENSIVE METABOLIC PANEL
ALBUMIN: 4.3 g/dL (ref 3.5–5.0)
ALK PHOS: 40 U/L (ref 38–126)
ALT: 51 U/L — ABNORMAL HIGH (ref 0–44)
AST: 36 U/L (ref 15–41)
Anion gap: 5 (ref 5–15)
BILIRUBIN TOTAL: 0.5 mg/dL (ref 0.3–1.2)
BUN: 13 mg/dL (ref 6–20)
CALCIUM: 8.9 mg/dL (ref 8.9–10.3)
CO2: 28 mmol/L (ref 22–32)
CREATININE: 0.77 mg/dL (ref 0.44–1.00)
Chloride: 104 mmol/L (ref 98–111)
GFR calc Af Amer: >60 mL/min (ref >60)
GFR calc non Af Amer: >60 mL/min (ref >60)
GLUCOSE: 106 mg/dL — AB (ref 70–99)
Potassium: 3.6 mmol/L (ref 3.5–5.1)
Sodium: 137 mmol/L (ref 135–145)
TOTAL PROTEIN: 7.5 g/dL (ref 6.5–8.1)

## 2018-04-30 LAB — URINALYSIS, COMPLETE (UACMP) WITH MICROSCOPIC
Bacteria, UA: NONE SEEN
Bilirubin Urine: NEGATIVE
Glucose, UA: NEGATIVE mg/dL
Hgb urine dipstick: NEGATIVE
Ketones, ur: NEGATIVE mg/dL
Leukocytes, UA: NEGATIVE
Nitrite: NEGATIVE
PH: 7 (ref 5.0–8.0)
Protein, ur: NEGATIVE mg/dL
SPECIFIC GRAVITY, URINE: 1.018 (ref 1.005–1.030)

## 2018-04-30 LAB — POCT PREGNANCY, URINE: Preg Test, Ur: NEGATIVE

## 2018-04-30 MED ORDER — METHYLPREDNISOLONE 4 MG PO TBPK: ORAL_TABLET | ORAL | 0 refills | Status: DC

## 2018-04-30 NOTE — ED Provider Notes (Signed)
[facility] Emergency Department Provider Note  ____________________________________________   First MD Initiated Contact with Patient 04/30/18 1615     (approximate)  I have reviewed the triage vital signs and the nursing notes.   HISTORY  Chief Complaint Cough    HPI Kathy Welch is a 32 y.o. female presents emergency department complaining of cough and not feeling any better after using a Z-Pak.  She finished a Z-Pak 3 days ago.  She denies any fever or chills.  She denies chest pain or shortness of breath.  She states she does not feel like she is breathing right and has used her inhaler more often.  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  She states the ceiling is brown in color.  She states she does spend quite a lot of time in this facility.  She is also concerned because she has a history of endometriosis which she states the physician told her was extremely bad.  When she had former shortness of breath and lung problems her physician was concerned that she might have endometriosis in the lung.  Patient was recently evaluated at [facility] for abdominal pain.  On the CT with contrast they did note some left lower lung infiltrate which look like shattered glass.  This was when the put her on the Z-Pak.  She states she had been being evaluated for diverticulitis and this was an incidental finding.    Past Medical History:  Diagnosis Date  . Anxiety   . Asthma    as a child  . Bipolar disorder ([place])   . Complication of anesthesia    nausea  . Seizures ([place])    "at age 8 after giving blood"  . UTI (lower urinary tract infection)     Patient Active Problem List   Diagnosis Date Noted  . Endometriosis determined by laparoscopy 01/22/2016    Past Surgical History:  Procedure Laterality Date  . back surgeries    . CHROMOPERTUBATION Bilateral 01/22/2016   Procedure: CHROMOPERTUBATION;  Surgeon: [name], MD;  Location: [facility] ORS;  Service: Gynecology;  Laterality: Bilateral;  . CYSTOSCOPY N/A 01/22/2016   Procedure: CYSTOSCOPY;  Surgeon: [name], MD;  Location: [facility] ORS;  Service: Gynecology;  Laterality: N/A;  . KNEE ARTHROSCOPY Right   . LAPAROSCOPIC APPENDECTOMY  01/22/2016   Procedure: APPENDECTOMY LAPAROSCOPIC;  Surgeon: [name], MD;  Location: [facility] ORS;  Service: Gynecology;;  . LAPAROSCOPIC LYSIS OF ADHESIONS Bilateral 01/22/2016   Procedure: LAPAROSCOPIC LYSIS OF ADHESIONS;  Surgeon: [name], MD;  Location: [facility] ORS;  Service: Gynecology;  Laterality: Bilateral;  . LAPAROSCOPY    . LAPAROSCOPY N/A 01/22/2016   Procedure: LAPAROSCOPY DIAGNOSTIC/ pap smear;  Surgeon: [name], MD;  Location: [facility] ORS;  Service: Gynecology;  Laterality: N/A;  . LAPAROSCOPY N/A 01/22/2016   Procedure: LAPAROSCOPY OPERATIVE - excision of endometriosis;  Surgeon: [name], MD;  Location: [facility] ORS;  Service: Gynecology;  Laterality: N/A;  . SKIN GRAFT     lip    Prior to Admission medications   Medication Sig Start Date End Date Taking? Authorizing Provider  ALPRAZolam Duanne Moron) 0.5 MG tablet Take 0.5 mg by mouth 3 (three) times daily as needed for anxiety.    [provider]  azithromycin (ZITHROMAX) 250 MG tablet Take 1 tablet (250 mg total) by mouth daily. Take first 2 tablets together, then 1 every day until finished. 04/21/18  Larene Pickett, PA-C  buPROPion [facility] SR) 150 MG 12 hr tablet Take 150 mg by mouth 2 (two) times daily.    [provider]  cetirizine (ZYRTEC) 10 MG tablet Take 10 mg by mouth 2 (two) times daily.    [provider]  escitalopram (LEXAPRO) 20 MG tablet Take 40 mg by mouth daily.    [provider]  gabapentin (NEURONTIN) 600 MG tablet Take 1 tablet (600 mg total) by mouth daily. Patient taking differently: Take 600 mg by mouth 2 (two) times daily.   06/21/16 04/21/26  Earleen Newport, MD  hydrOXYzine (ATARAX/VISTARIL) 25 MG tablet Take 25 mg by mouth 2 (two) times daily.    [provider]  methylPREDNISolone (MEDROL DOSEPAK) 4 MG TBPK tablet Take 6 pills on day one then decrease by 1 pill each day 04/30/18   Versie Starks, PA-C  traMADol (ULTRAM) 50 MG tablet Take 1 tablet (50 mg total) by mouth every 6 (six) hours as needed. 04/21/18   Larene Pickett, PA-C    Allergies Sulfa antibiotics  No family history on file.  Social History Social History   Tobacco Use  . Smoking status: Never Smoker  . Smokeless tobacco: Never Used  Substance Use Topics  . Alcohol use: Yes    Comment: occ  . Drug use: No    Review of Systems  Constitutional: No fever/chills Eyes: No visual changes. ENT: No sore throat. Respiratory: Positive cough Gastrointestinal: Denies abdominal pain  genitourinary: Negative for dysuria. Musculoskeletal: Negative for back pain. Skin: Negative for rash.    ____________________________________________   PHYSICAL EXAM:  VITAL SIGNS: ED Triage Vitals  Enc Vitals Group     BP 04/30/18 1606 (!) 139/91     Pulse Rate 04/30/18 1606 79     Resp 04/30/18 1606 20     Temp 04/30/18 1606 98.2 F (36.8 C)     Temp Source 04/30/18 1606 Oral     SpO2 04/30/18 1606 100 %     Weight 04/30/18 1605 160 lb (72.6 kg)     Height 04/30/18 1605 5\' 2"  (1.575 m)     Head Circumference --      Peak Flow --      Pain Score 04/30/18 1604 7     Pain Loc --      Pain Edu? --      Excl. in [place]? --     Constitutional: Alert and oriented. Well appearing and in no acute distress.  Vitals are normal Eyes: Conjunctivae are normal.  Head: Atraumatic. Nose: No congestion/rhinnorhea. Mouth/Throat: Mucous membranes are moist.   Neck:  supple no lymphadenopathy noted Cardiovascular: Normal rate, regular rhythm. Heart sounds are normal Respiratory: Normal respiratory effort.  No retractions, lungs c t a, no  wheezing is noted Abd: soft nontender bs normal all 4 quad GU: deferred Musculoskeletal: FROM all extremities, warm and well perfused Neurologic:  Normal speech and language.  Skin:  Skin is warm, dry and intact. No rash noted. Psychiatric: Mood and affect are normal. Speech and behavior are normal.  ____________________________________________   LABS (all labs ordered are listed, but only abnormal results are displayed)  Labs Reviewed  CBC WITH DIFFERENTIAL/PLATELET - Abnormal; Notable for the following components:      Result Value   Hemoglobin 11.9 (*)    All other components within normal limits  COMPREHENSIVE METABOLIC PANEL - Abnormal; Notable for the following components:   Glucose, Bld 106 (*)    ALT  51 (*)    All other components within normal limits  URINALYSIS, COMPLETE (UACMP) WITH MICROSCOPIC - Abnormal; Notable for the following components:   Color, Urine YELLOW (*)    APPearance CLEAR (*)    All other components within normal limits  POC URINE PREG, ED  POCT PREGNANCY, URINE   ____________________________________________   ____________________________________________  RADIOLOGY  Chest x-ray is negative for any acute abnormality  ____________________________________________   PROCEDURES  Procedure(s) performed: No  Procedures    ____________________________________________   INITIAL IMPRESSION / ASSESSMENT AND PLAN / ED COURSE  Pertinent labs & imaging results that were available during my care of the patient were reviewed by me and considered in my medical decision making (see chart for details).   Patient is 32 year old female presents emergency department with concerns of continued cough after finishing a Z-Pak.  She states she was evaluated at [facility] for left lower quadrant pain and an incidental finding on the CT of the abdomen and pelvis showed a infiltrate in the left lower lung.  She states she was then placed on an antibiotic which  was a Z-Pak.  She states on Tuesday of this week she developed a headache and nausea and vomiting.  She had increased right upper quadrant pain at this time.  She denies any nausea, vomiting or diarrhea since.  She finished the Z-Pak 3 days ago which would be approximately Monday.  On physical exam the patient appears very well.  Lungs are clear to auscultation and heart sounds are normal.  The right upper quadrant is mildly tender to palpation.  The remainder of the exam is remarkable  Chest x-ray is negative.  Urinalysis is negative.  CBC shows a white blood cell of 7.7.  Comprehensive metabolic panel has normal liver enzymes and kidney function.  Urine pregnant is negative.  On review of the CT from [facility], the CT showed shattered glass tight lesion in the left lower lung.  On review of her other CT reports she had a CT for PE and NGO in 2017 which showed a questionable round pneumonia in the same area.  Discussed these findings with patient.  Explained to her that if she is being exposed to mold over and over this would affect her breathing.  I encouraged her to see a pulmonologist.  Due to the patient's normal heart rate, oxygen level and no complaint of chest pain I do not feel that this is a PE.  We also had a discussion on whether the lesion in the lung could be endometrial or not.  Explained to her that this once again would be a good reason to see a pulmonologist as there have been cases where endometrial tissue is been found in other places of the body.  However I feel that more likely her symptoms are being related to the mold that may be in her workplace.  We discussed whether or not to do a CT of her chest at this time.  Explained her that she has had so many CTs in the past few years that due to the low risk of PE I would prefer not to do a CT.  She agrees that this is probably not a PE and agrees the CT is probably not needed at this time.  Explained to her that due to the 3 previous  CTs if we continue to do CTs this will increase her chance of cancer later in life.  She states she understands and will follow  up with a pulmonologist.  She is to return to the emergency department if she is worsening.  She was encouraged to take B12 for energy and magnesium for her muscle aches.  She is to take the steroid pack as instructed for the cough and wheezing.  Try to avoid the areas for the mold may be present in her workplace.  And follow-up with pulmonology and her regular doctor.  She states she understands and will comply.  She was discharged in stable condition.     As part of my medical decision making, I reviewed the following data within the [facility] notes reviewed and incorporated, Labs reviewed urinalysis is normal, CBC with white count of 7.7, normal comprehensive metabolic panel.  Negative urine pregnant, Old chart reviewed, Radiograph reviewed chest x-ray is normal, Notes from prior ED visits and [place] Controlled Substance Database  ____________________________________________   FINAL CLINICAL IMPRESSION(S) / ED DIAGNOSES  Final diagnoses:  Cough  Mold exposure      NEW MEDICATIONS STARTED DURING THIS VISIT:  Discharge Medication List as of 04/30/2018  6:12 PM    START taking these medications   Details  methylPREDNISolone (MEDROL DOSEPAK) 4 MG TBPK tablet Take 6 pills on day one then decrease by 1 pill each day, Print         Note:  This document was prepared using Dragon voice recognition software and may include unintentional dictation errors.    Versie Starks, PA-C 04/30/18 2154    Merlyn Lot, MD 04/30/18 [phone]

## 2018-04-30 NOTE — ED Triage Notes (Signed)
Pt dx with pneumonia last week.  Finished zpak 3 days ago.  Pt states no better.

## 2018-04-30 NOTE — ED Notes (Signed)
See triage note  States she was dx'd with pneumonia last week  Recently finished antibiotics  Conts to have cough and doesn't feel any better  Afebrile on arrival

## 2018-04-30 NOTE — Discharge Instructions (Signed)
Lookup mold exposure on [employer].  This will give you the signs and symptoms of mold exposure.  Follow-up with a pulmonologist for evaluation of the lung.  Take vitamin B12 for energy, magnesium for muscle soreness.  Return emergency department if worsening

## 2018-05-19 ENCOUNTER — Ambulatory Visit: Payer: BLUE CROSS/BLUE SHIELD | Admitting: Emergency Medicine

## 2018-05-19 ENCOUNTER — Encounter: Payer: Self-pay | Admitting: Emergency Medicine

## 2018-05-19 VITALS — BP 128/76 | HR 93 | Ht 63.0 in | Wt 164.4 lb

## 2018-05-19 DIAGNOSIS — J45909 Unspecified asthma, uncomplicated: Secondary | ICD-10-CM | POA: Diagnosis not present

## 2018-05-19 DIAGNOSIS — R9389 Abnormal findings on diagnostic imaging of other specified body structures: Secondary | ICD-10-CM

## 2018-05-19 DIAGNOSIS — D869 Sarcoidosis, unspecified: Secondary | ICD-10-CM | POA: Insufficient documentation

## 2018-05-19 DIAGNOSIS — R918 Other nonspecific abnormal finding of lung field: Secondary | ICD-10-CM

## 2018-05-19 NOTE — Assessment & Plan Note (Signed)
She had childhood asthma and some of her symptoms could be consistent with adult asthma as well.  She has been using albuterol with some intermittent relief.  Unclear how much of her current presentation is related to obstruction, or if she still has asthma at all.  The mold exposure is concerning for possible trigger, even ABPA or invasive fungal disease.  We may need to consider Aspergillus antibody testing depending on how the work-up of her left lower lobe infiltrate proceeds.

## 2018-05-19 NOTE — Patient Instructions (Signed)
We will perform a CT of your chest with contrast to evaluate left lower lobe groundglass opacity. We will perform full pulmonary function testing Depending on your results we will decide whether to arrange for bronchoscopy and tissue evaluation of your left lower lobe. Keep your albuterol available to use 2 puffs up to every 4 hours if needed for shortness of breath.  Follow with Dr Lamonte Sakai next available after your studies to review the results together.

## 2018-05-19 NOTE — Assessment & Plan Note (Addendum)
She has a left basilar rounded groundglass opacity that was present as far back as 07/2016.  Her current CT abdomen shows an opacity in the same region that appears to be larger, remains groundglass without a true solid component.  It does appear to abut the pleura medially at the dome of the diaphragm.  This could explain her pain which was abdominal deep but also pleuritic in nature.  Etiology of the lesion is unclear but certainly must consider thoracic endometriosis.  She has not had any hemoptysis and has never had a pleural effusion or pneumothorax.    Other considerations would be some sort of noninfectious inflammatory process especially given her multiple complaints that are suggestive of an autoimmune process.  Her rheumatological evaluation has thus far been reassuring.  This could be recurrent bacterial pneumonia but a clinical syndrome was not entirely consistent.  Finally consider a slow-growing adenocarcinoma in a never smoker.  I believe we need to repeat her CT scan of the chest, characterize the left lower lobe lesion, look for any other findings in the chest, nodules.  If the left lower lobe opacity persists then I think we should strongly consider bronchoscopy with culture data, biopsies.  Consider aspergillus antibody testing as we go forward

## 2018-05-19 NOTE — Progress Notes (Signed)
Subjective:    Patient ID: Kathy Welch, female    DOB: 10/23/1985, 32 y.o.   MRN: [id number]  HPI 32 year old never smoker with a history of bipolar disorder, childhood asthma, probable adult asthma as well, restarted albuterol 2012.  She has a history of stage IV biopsy-proven endometriosis and has had endometrial ablations in 2009 and 2016, was treated with Lupron.  Also with a history of some rash that appears to be migratory, possibly psoriatic, associated with dizziness fatigue nausea.  This prompted rheumatological evaluation but no unifying diagnosis has been given.  She is referred today for left abdomen and pleuritic pain and an abnormal CT scan of the chest.   She was seen in the emergency department 04/21/2018 for left-sided abdominal pain cough, sneezing, pleuritic pain.  No fever or sputum.  A CT scan of her abdomen was done on 04/21/2018 that I reviewed.  The lung windows show a rounded left basilar infiltrate, principally groundglass in nature, suspicious for pneumonia. She was treated with azithro, but remained symptomatic. Prescribed pred but didn't take it. She is still having some SOB at rest, some chest tightness, fatigue, nausea, dizziness.   In retrospect she had a CT chest 07/26/2016 that showed a smaller rounded L basilar opacity, also GG, in almost the exact same location.  No clear etiology of this was determined.  She has a definite mold exposure.  Unclear whether this is related to her current presentation but certainly could cause reactive airways disease, etc.  No clear indication or symptoms to suggest invasive fungal disease.  Review of Systems  Constitutional: Positive for fever. Negative for unexpected weight change.  HENT: Positive for congestion, nosebleeds, sinus pressure, sneezing, sore throat and trouble swallowing. Negative for dental problem, ear pain, postnasal drip and rhinorrhea.   Eyes: Negative for redness and itching.  Respiratory: Positive  for cough, chest tightness, shortness of breath and wheezing.   Cardiovascular: Positive for palpitations. Negative for leg swelling.  Gastrointestinal: Positive for nausea and vomiting.  Genitourinary: Negative for dysuria.  Musculoskeletal: Negative for joint swelling.  Skin: Negative for rash.  Allergic/Immunologic: Negative.  Negative for environmental allergies, food allergies and immunocompromised state.  Neurological: Positive for dizziness and headaches.  Hematological: Does not bruise/bleed easily.  Psychiatric/Behavioral: Negative for dysphoric mood. The patient is nervous/anxious.    Past Medical History:  Diagnosis Date  . Anxiety   . Asthma    as a child  . Bipolar disorder ([place])   . Complication of anesthesia    nausea  . Seizures ([place])    "at age 5 after giving blood"  . UTI (lower urinary tract infection)      No family history on file.   Social History   Socioeconomic History  . Marital status: Single    Spouse name: Not on file  . Number of children: Not on file  . Years of education: Not on file  . Highest education level: Not on file  Occupational History  . Not on file  Social Needs  . Financial resource strain: Not on file  . Food insecurity:    Worry: Not on file    Inability: Not on file  . Transportation needs:    Medical: Not on file    Non-medical: Not on file  Tobacco Use  . Smoking status: Never Smoker  . Smokeless tobacco: Never Used  Substance and Sexual Activity  . Alcohol use: Yes    Comment: occ  . Drug use: No  .  Sexual activity: Not on file  Lifestyle  . Physical activity:    Days per week: Not on file    Minutes per session: Not on file  . Stress: Not on file  Relationships  . Social connections:    Talks on phone: Not on file    Gets together: Not on file    Attends religious service: Not on file    Active member of club or organization: Not on file    Attends meetings of clubs or organizations: Not on file     Relationship status: Not on file  . Intimate partner violence:    Fear of current or ex partner: Not on file    Emotionally abused: Not on file    Physically abused: Not on file    Forced sexual activity: Not on file  Other Topics Concern  . Not on file  Social History Narrative  . Not on file     Allergies  Allergen Reactions  . Sulfa Antibiotics Hives     Outpatient Medications Prior to Visit  Medication Sig Dispense Refill  . ALPRAZolam (XANAX) 0.5 MG tablet Take 0.5 mg by mouth 3 (three) times daily as needed for anxiety.    Marland Kitchen buPROPion (WELLBUTRIN SR) 150 MG 12 hr tablet Take 150 mg by mouth 2 (two) times daily.    . cetirizine (ZYRTEC) 10 MG tablet Take 10 mg by mouth 2 (two) times daily.    Marland Kitchen gabapentin (NEURONTIN) 600 MG tablet Take 1 tablet (600 mg total) by mouth daily. (Patient taking differently: Take 600 mg by mouth 2 (two) times daily. ) 30 tablet 2  . hydrOXYzine (ATARAX/VISTARIL) 25 MG tablet Take 25 mg by mouth 2 (two) times daily.    . rizatriptan (MAXALT) 10 MG tablet Take 10 mg by mouth as needed for migraine. May repeat in 2 hours if needed    . azithromycin (ZITHROMAX) 250 MG tablet Take 1 tablet (250 mg total) by mouth daily. Take first 2 tablets together, then 1 every day until finished. (Patient not taking: Reported on 05/19/2018) 6 tablet 0  . escitalopram (LEXAPRO) 20 MG tablet Take 40 mg by mouth daily.    . methylPREDNISolone (MEDROL DOSEPAK) 4 MG TBPK tablet Take 6 pills on day one then decrease by 1 pill each day (Patient not taking: Reported on 05/19/2018) 21 tablet 0  . traMADol (ULTRAM) 50 MG tablet Take 1 tablet (50 mg total) by mouth every 6 (six) hours as needed. (Patient not taking: Reported on 05/19/2018) 15 tablet 0   No facility-administered medications prior to visit.         Objective:   Physical Exam Vitals:   05/19/18 1521  BP: 128/76  Pulse: 93  SpO2: 100%  Weight: 164 lb 6.4 oz (74.6 kg)  Height: 5\' 3"  (1.6 m)   Gen: Pleasant,  well-nourished, in no distress,  normal affect  ENT: No lesions,  mouth clear,  oropharynx clear, no postnasal drip  Neck: No JVD, no stridor  Lungs: No use of accessory muscles, no wheeze or crackles.   Cardiovascular: RRR, heart sounds normal, no murmur or gallops, no peripheral edema  Musculoskeletal: No deformities, no cyanosis or clubbing  Neuro: alert, non focal  Skin: Warm, no lesions or rash     Assessment & Plan:  Abnormal CT of the chest She has a left basilar rounded groundglass opacity that was present as far back as 07/2016.  Her current CT abdomen shows an opacity in the same  region that appears to be larger, remains groundglass without a true solid component.  It does appear to abut the pleura medially at the dome of the diaphragm.  This could explain her pain which was abdominal deep but also pleuritic in nature.  Etiology of the lesion is unclear but certainly must consider thoracic endometriosis.  She has not had any hemoptysis and has never had a pleural effusion or pneumothorax.    Other considerations would be some sort of noninfectious inflammatory process especially given her multiple complaints that are suggestive of an autoimmune process.  Her rheumatological evaluation has thus far been reassuring.  This could be recurrent bacterial pneumonia but a clinical syndrome was not entirely consistent.  Finally consider a slow-growing adenocarcinoma in a never smoker.  I believe we need to repeat her CT scan of the chest, characterize the left lower lobe lesion, look for any other findings in the chest, nodules.  If the left lower lobe opacity persists then I think we should strongly consider bronchoscopy with culture data, biopsies.  Consider aspergillus antibody testing as we go forward  Asthma She had childhood asthma and some of her symptoms could be consistent with adult asthma as well.  She has been using albuterol with some intermittent relief.  Unclear how much of  her current presentation is related to obstruction, or if she still has asthma at all.  The mold exposure is concerning for possible trigger, even ABPA or invasive fungal disease.  We may need to consider Aspergillus antibody testing depending on how the work-up of her left lower lobe infiltrate proceeds.  Baltazar Apo, MD, PhD 05/19/2018, 5:36 PM [facility] [phone] or if no answer [phone]

## 2018-05-26 ENCOUNTER — Ambulatory Visit (INDEPENDENT_AMBULATORY_CARE_PROVIDER_SITE_OTHER)
Admission: RE | Admit: 2018-05-26 | Discharge: 2018-05-26 | Disposition: A | Discharge status: Home / Self Care | Payer: BLUE CROSS/BLUE SHIELD | Source: Ambulatory Visit | Attending: Emergency Medicine | Admitting: Emergency Medicine

## 2018-05-26 ENCOUNTER — Other Ambulatory Visit: Payer: BLUE CROSS/BLUE SHIELD

## 2018-05-26 ENCOUNTER — Encounter (INDEPENDENT_AMBULATORY_CARE_PROVIDER_SITE_OTHER): Payer: Self-pay

## 2018-05-26 DIAGNOSIS — R918 Other nonspecific abnormal finding of lung field: Secondary | ICD-10-CM

## 2018-05-26 MED ORDER — IOPAMIDOL (ISOVUE-300) INJECTION 61%
100.0000 mL | Freq: Once | INTRAVENOUS | Status: AC | PRN
  Administered 2018-05-26: 80 mL via INTRAVENOUS

## 2018-06-04 ENCOUNTER — Ambulatory Visit (INDEPENDENT_AMBULATORY_CARE_PROVIDER_SITE_OTHER): Payer: BLUE CROSS/BLUE SHIELD | Admitting: Emergency Medicine

## 2018-06-04 ENCOUNTER — Encounter: Payer: Self-pay | Admitting: Emergency Medicine

## 2018-06-04 VITALS — BP 124/72 | HR 80 | Ht 62.0 in | Wt 162.0 lb

## 2018-06-04 DIAGNOSIS — R9389 Abnormal findings on diagnostic imaging of other specified body structures: Secondary | ICD-10-CM | POA: Diagnosis not present

## 2018-06-04 DIAGNOSIS — R911 Solitary pulmonary nodule: Secondary | ICD-10-CM | POA: Diagnosis not present

## 2018-06-04 DIAGNOSIS — J45909 Unspecified asthma, uncomplicated: Secondary | ICD-10-CM

## 2018-06-04 LAB — PULMONARY FUNCTION TEST
DL/VA % pred: 119 %
DL/VA: 5.41 ml/min/mmHg/L
DLCO COR: 22.53 ml/min/mmHg
DLCO cor % pred: 104 %
DLCO unc % pred: 99 %
DLCO unc: 21.42 ml/min/mmHg
FEF 25-75 Post: 2.51 L/sec
FEF 25-75 Pre: 2.99 L/sec
FEF2575-%CHANGE-POST: -16 %
FEF2575-%Pred-Post: 75 %
FEF2575-%Pred-Pre: 90 %
FEV1-%Change-Post: -1 %
FEV1-%PRED-PRE: 89 %
FEV1-%Pred-Post: 88 %
FEV1-POST: 2.63 L
FEV1-Pre: 2.67 L
FEV1FVC-%CHANGE-POST: -2 %
FEV1FVC-%PRED-PRE: 101 %
FEV6-%Change-Post: 2 %
FEV6-%Pred-Post: 89 %
FEV6-%Pred-Pre: 87 %
FEV6-PRE: 3.05 L
FEV6-Post: 3.14 L
FEV6FVC-%PRED-PRE: 101 %
FEV6FVC-%Pred-Post: 101 %
FVC-%CHANGE-POST: 0 %
FVC-%PRED-POST: 88 %
FVC-%Pred-Pre: 88 %
FVC-PRE: 3.11 L
FVC-Post: 3.14 L
POST FEV1/FVC RATIO: 84 %
Post FEV6/FVC ratio: 100 %
Pre FEV1/FVC ratio: 86 %
Pre FEV6/FVC Ratio: 100 %
RV % PRED: 98 %
RV: 1.29 L
TLC % pred: 92 %
TLC: 4.41 L

## 2018-06-04 NOTE — Progress Notes (Signed)
PFT completed 06/04/18

## 2018-06-04 NOTE — Patient Instructions (Signed)
We will arrange for navigational bronchoscopy to further investigate your left lower lobe opacity. Follow with Dr Lamonte Sakai next available to review the results together.

## 2018-06-04 NOTE — Assessment & Plan Note (Signed)
I reviewed the CT scan of the chest with her in detail today.  The irregular nodular opacity is still present in the left lower lobe.  We talked about the differential diagnosis.  I believe she needs bronchoscopy to better characterize and we will plan for navigational bronchoscopy under general anesthesia.  Explained this to her in detail.

## 2018-06-04 NOTE — Assessment & Plan Note (Signed)
Point function testing was reassuring today.  No evidence to support asthma.  She did have some flattening of her inspiratory component of her flow volume loop that could suggest a variable upper airway irritation or obstruction.

## 2018-06-04 NOTE — Progress Notes (Signed)
Subjective:    Patient ID: Kathy Welch, female    DOB: 03-09-1986, 32 y.o.   MRN: [id number]  HPI 32 year old never smoker with a history of bipolar disorder, childhood asthma, probable adult asthma as well, restarted albuterol 2012.  She has a history of stage IV biopsy-proven endometriosis and has had endometrial ablations in 2009 and 2016, was treated with Lupron.  Also with a history of some rash that appears to be migratory, possibly psoriatic, associated with dizziness fatigue nausea.  This prompted rheumatological evaluation but no unifying diagnosis has been given.  She is referred today for left abdomen and pleuritic pain and an abnormal CT scan of the chest.    She was seen in the emergency department 04/21/2018 for left-sided abdominal pain cough, sneezing, pleuritic pain.  No fever or sputum.  A CT scan of her abdomen was done on 04/21/2018 that I reviewed.  The lung windows show a rounded left basilar infiltrate, principally groundglass in nature, suspicious for pneumonia. She was treated with azithro, but remained symptomatic. Prescribed pred but didn't take it. She is still having some SOB at rest, some chest tightness, fatigue, nausea, dizziness.   In retrospect she had a CT chest 07/26/2016 that showed a smaller rounded L basilar opacity, also GG, in almost the exact same location.  No clear etiology of this was determined.  She has a definite mold exposure.  Unclear whether this is related to her current presentation but certainly could cause reactive airways disease, etc.  No clear indication or symptoms to suggest invasive fungal disease.  ROV 06/04/18 --this is a follow-up visit for 32 year old woman with suspected asthma and an abnormal CT scan of the chest with a groundglass left basilar opacity first noted by CT 07/2016 and seen again on abdominal CT last month.  We repeated her chest CT on 05/26/2018 and I have reviewed, confirms the persistence of a mixed density left  lower lobe groundglass opacity measuring 2.9 x 3.3 x 1.6 cm.  I do not see any other nodular opacities.  She also underwent pulmonary function testing today which shows normal airflows without a bronchodilator response, some flattening of the inspiratory loop of her flow volume loop consistent with possible variable upper airway obstruction.  Her lung volumes were normal and her diffusion capacity is normal.    Review of Systems  Constitutional: Positive for fever. Negative for unexpected weight change.  HENT: Positive for congestion, nosebleeds, sinus pressure, sneezing, sore throat and trouble swallowing. Negative for dental problem, ear pain, postnasal drip and rhinorrhea.   Eyes: Negative for redness and itching.  Respiratory: Positive for cough, chest tightness, shortness of breath and wheezing.   Cardiovascular: Positive for palpitations. Negative for leg swelling.  Gastrointestinal: Positive for nausea and vomiting.  Genitourinary: Negative for dysuria.  Musculoskeletal: Negative for joint swelling.  Skin: Negative for rash.  Allergic/Immunologic: Negative.  Negative for environmental allergies, food allergies and immunocompromised state.  Neurological: Positive for dizziness and headaches.  Hematological: Does not bruise/bleed easily.  Psychiatric/Behavioral: Negative for dysphoric mood. The patient is nervous/anxious.    Past Medical History:  Diagnosis Date  . Anxiety   . Asthma    as a child  . Bipolar disorder ([place])   . Complication of anesthesia    nausea  . Seizures ([place])    "at age 15 after giving blood"  . UTI (lower urinary tract infection)      No family history on file.   Social History  Socioeconomic History  . Marital status: Single    Spouse name: Not on file  . Number of children: Not on file  . Years of education: Not on file  . Highest education level: Not on file  Occupational History  . Not on file  Social Needs  . Financial resource strain: Not  on file  . Food insecurity:    Worry: Not on file    Inability: Not on file  . Transportation needs:    Medical: Not on file    Non-medical: Not on file  Tobacco Use  . Smoking status: Never Smoker  . Smokeless tobacco: Never Used  Substance and Sexual Activity  . Alcohol use: Yes    Comment: occ  . Drug use: No  . Sexual activity: Not on file  Lifestyle  . Physical activity:    Days per week: Not on file    Minutes per session: Not on file  . Stress: Not on file  Relationships  . Social connections:    Talks on phone: Not on file    Gets together: Not on file    Attends religious service: Not on file    Active member of club or organization: Not on file    Attends meetings of clubs or organizations: Not on file    Relationship status: Not on file  . Intimate partner violence:    Fear of current or ex partner: Not on file    Emotionally abused: Not on file    Physically abused: Not on file    Forced sexual activity: Not on file  Other Topics Concern  . Not on file  Social History Narrative  . Not on file     Allergies  Allergen Reactions  . Sulfa Antibiotics Hives     Outpatient Medications Prior to Visit  Medication Sig Dispense Refill  . ALPRAZolam (XANAX) 0.5 MG tablet Take 0.5 mg by mouth 3 (three) times daily as needed for anxiety.    Marland Kitchen buPROPion (WELLBUTRIN SR) 150 MG 12 hr tablet Take 150 mg by mouth 2 (two) times daily.    . cetirizine (ZYRTEC) 10 MG tablet Take 10 mg by mouth 2 (two) times daily.    Marland Kitchen gabapentin (NEURONTIN) 600 MG tablet Take 1 tablet (600 mg total) by mouth daily. (Patient taking differently: Take 600 mg by mouth 2 (two) times daily. ) 30 tablet 2  . hydrOXYzine (ATARAX/VISTARIL) 25 MG tablet Take 25 mg by mouth 2 (two) times daily.    . rizatriptan (MAXALT) 10 MG tablet Take 10 mg by mouth as needed for migraine. May repeat in 2 hours if needed    . traMADol (ULTRAM) 50 MG tablet Take 1 tablet (50 mg total) by mouth every 6 (six) hours  as needed. 15 tablet 0  . azithromycin (ZITHROMAX) 250 MG tablet Take 1 tablet (250 mg total) by mouth daily. Take first 2 tablets together, then 1 every day until finished. (Patient not taking: Reported on 05/19/2018) 6 tablet 0  . methylPREDNISolone (MEDROL DOSEPAK) 4 MG TBPK tablet Take 6 pills on day one then decrease by 1 pill each day (Patient not taking: Reported on 05/19/2018) 21 tablet 0  . escitalopram (LEXAPRO) 20 MG tablet Take 40 mg by mouth daily.     No facility-administered medications prior to visit.         Objective:   Physical Exam Vitals:   06/04/18 1515  BP: 124/72  Pulse: 80  SpO2: 94%  Weight: 162 lb (  73.5 kg)  Height: 5\' 2"  (1.575 m)   Gen: Pleasant, well-nourished, in no distress,  normal affect  ENT: No lesions,  mouth clear,  oropharynx clear, no postnasal drip  Neck: No JVD, no stridor  Lungs: No use of accessory muscles, no wheeze or crackles.   Cardiovascular: RRR, heart sounds normal, no murmur or gallops, no peripheral edema  Musculoskeletal: No deformities, no cyanosis or clubbing  Neuro: alert, non focal  Skin: Warm, no lesions or rash     Assessment & Plan:  Abnormal CT of the chest I reviewed the CT scan of the chest with her in detail today.  The irregular nodular opacity is still present in the left lower lobe.  We talked about the differential diagnosis.  I believe she needs bronchoscopy to better characterize and we will plan for navigational bronchoscopy under general anesthesia.  Explained this to her in detail.  Asthma Point function testing was reassuring today.  No evidence to support asthma.  She did have some flattening of her inspiratory component of her flow volume loop that could suggest a variable upper airway irritation or obstruction.  Baltazar Apo, MD, PhD 06/04/2018, 4:58 PM [facility] [phone] or if no answer [phone]

## 2018-06-04 NOTE — H&P (View-Only) (Signed)
Subjective:    Patient ID: Kathy Welch, female    DOB: 1986-03-09, 32 y.o.   MRN: [id number]  HPI 32 year old never smoker with a history of bipolar disorder, childhood asthma, probable adult asthma as well, restarted albuterol 2012.  She has a history of stage IV biopsy-proven endometriosis and has had endometrial ablations in 2009 and 2016, was treated with Lupron.  Also with a history of some rash that appears to be migratory, possibly psoriatic, associated with dizziness fatigue nausea.  This prompted rheumatological evaluation but no unifying diagnosis has been given.  She is referred today for left abdomen and pleuritic pain and an abnormal CT scan of the chest.    She was seen in the emergency department 04/21/2018 for left-sided abdominal pain cough, sneezing, pleuritic pain.  No fever or sputum.  A CT scan of her abdomen was done on 04/21/2018 that I reviewed.  The lung windows show a rounded left basilar infiltrate, principally groundglass in nature, suspicious for pneumonia. She was treated with azithro, but remained symptomatic. Prescribed pred but didn't take it. She is still having some SOB at rest, some chest tightness, fatigue, nausea, dizziness.   In retrospect she had a CT chest 07/26/2016 that showed a smaller rounded L basilar opacity, also GG, in almost the exact same location.  No clear etiology of this was determined.  She has a definite mold exposure.  Unclear whether this is related to her current presentation but certainly could cause reactive airways disease, etc.  No clear indication or symptoms to suggest invasive fungal disease.  ROV 06/04/18 --this is a follow-up visit for 32 year old woman with suspected asthma and an abnormal CT scan of the chest with a groundglass left basilar opacity first noted by CT 07/2016 and seen again on abdominal CT last month.  We repeated her chest CT on 05/26/2018 and I have reviewed, confirms the persistence of a mixed density left  lower lobe groundglass opacity measuring 2.9 x 3.3 x 1.6 cm.  I do not see any other nodular opacities.  She also underwent pulmonary function testing today which shows normal airflows without a bronchodilator response, some flattening of the inspiratory loop of her flow volume loop consistent with possible variable upper airway obstruction.  Her lung volumes were normal and her diffusion capacity is normal.    Review of Systems  Constitutional: Positive for fever. Negative for unexpected weight change.  HENT: Positive for congestion, nosebleeds, sinus pressure, sneezing, sore throat and trouble swallowing. Negative for dental problem, ear pain, postnasal drip and rhinorrhea.   Eyes: Negative for redness and itching.  Respiratory: Positive for cough, chest tightness, shortness of breath and wheezing.   Cardiovascular: Positive for palpitations. Negative for leg swelling.  Gastrointestinal: Positive for nausea and vomiting.  Genitourinary: Negative for dysuria.  Musculoskeletal: Negative for joint swelling.  Skin: Negative for rash.  Allergic/Immunologic: Negative.  Negative for environmental allergies, food allergies and immunocompromised state.  Neurological: Positive for dizziness and headaches.  Hematological: Does not bruise/bleed easily.  Psychiatric/Behavioral: Negative for dysphoric mood. The patient is nervous/anxious.    Past Medical History:  Diagnosis Date  . Anxiety   . Asthma    as a child  . Bipolar disorder ([place])   . Complication of anesthesia    nausea  . Seizures ([place])    "at age 32 after giving blood"  . UTI (lower urinary tract infection)      No family history on file.   Social History  Socioeconomic History  . Marital status: Single    Spouse name: Not on file  . Number of children: Not on file  . Years of education: Not on file  . Highest education level: Not on file  Occupational History  . Not on file  Social Needs  . Financial resource strain: Not  on file  . Food insecurity:    Worry: Not on file    Inability: Not on file  . Transportation needs:    Medical: Not on file    Non-medical: Not on file  Tobacco Use  . Smoking status: Never Smoker  . Smokeless tobacco: Never Used  Substance and Sexual Activity  . Alcohol use: Yes    Comment: occ  . Drug use: No  . Sexual activity: Not on file  Lifestyle  . Physical activity:    Days per week: Not on file    Minutes per session: Not on file  . Stress: Not on file  Relationships  . Social connections:    Talks on phone: Not on file    Gets together: Not on file    Attends religious service: Not on file    Active member of club or organization: Not on file    Attends meetings of clubs or organizations: Not on file    Relationship status: Not on file  . Intimate partner violence:    Fear of current or ex partner: Not on file    Emotionally abused: Not on file    Physically abused: Not on file    Forced sexual activity: Not on file  Other Topics Concern  . Not on file  Social History Narrative  . Not on file     Allergies  Allergen Reactions  . Sulfa Antibiotics Hives     Outpatient Medications Prior to Visit  Medication Sig Dispense Refill  . ALPRAZolam (XANAX) 0.5 MG tablet Take 0.5 mg by mouth 3 (three) times daily as needed for anxiety.    Marland Kitchen buPROPion (WELLBUTRIN SR) 150 MG 12 hr tablet Take 150 mg by mouth 2 (two) times daily.    . cetirizine (ZYRTEC) 10 MG tablet Take 10 mg by mouth 2 (two) times daily.    Marland Kitchen gabapentin (NEURONTIN) 600 MG tablet Take 1 tablet (600 mg total) by mouth daily. (Patient taking differently: Take 600 mg by mouth 2 (two) times daily. ) 30 tablet 2  . hydrOXYzine (ATARAX/VISTARIL) 25 MG tablet Take 25 mg by mouth 2 (two) times daily.    . rizatriptan (MAXALT) 10 MG tablet Take 10 mg by mouth as needed for migraine. May repeat in 2 hours if needed    . traMADol (ULTRAM) 50 MG tablet Take 1 tablet (50 mg total) by mouth every 6 (six) hours  as needed. 15 tablet 0  . azithromycin (ZITHROMAX) 250 MG tablet Take 1 tablet (250 mg total) by mouth daily. Take first 2 tablets together, then 1 every day until finished. (Patient not taking: Reported on 05/19/2018) 6 tablet 0  . methylPREDNISolone (MEDROL DOSEPAK) 4 MG TBPK tablet Take 6 pills on day one then decrease by 1 pill each day (Patient not taking: Reported on 05/19/2018) 21 tablet 0  . escitalopram (LEXAPRO) 20 MG tablet Take 40 mg by mouth daily.     No facility-administered medications prior to visit.         Objective:   Physical Exam Vitals:   06/04/18 1515  BP: 124/72  Pulse: 80  SpO2: 94%  Weight: 162 lb (  73.5 kg)  Height: 5\' 2"  (1.575 m)   Gen: Pleasant, well-nourished, in no distress,  normal affect  ENT: No lesions,  mouth clear,  oropharynx clear, no postnasal drip  Neck: No JVD, no stridor  Lungs: No use of accessory muscles, no wheeze or crackles.   Cardiovascular: RRR, heart sounds normal, no murmur or gallops, no peripheral edema  Musculoskeletal: No deformities, no cyanosis or clubbing  Neuro: alert, non focal  Skin: Warm, no lesions or rash     Assessment & Plan:  Abnormal CT of the chest I reviewed the CT scan of the chest with her in detail today.  The irregular nodular opacity is still present in the left lower lobe.  We talked about the differential diagnosis.  I believe she needs bronchoscopy to better characterize and we will plan for navigational bronchoscopy under general anesthesia.  Explained this to her in detail.  Asthma Point function testing was reassuring today.  No evidence to support asthma.  She did have some flattening of her inspiratory component of her flow volume loop that could suggest a variable upper airway irritation or obstruction.  Baltazar Apo, MD, PhD 06/04/2018, 4:58 PM [facility] [phone] or if no answer [phone]

## 2018-06-08 ENCOUNTER — Encounter (HOSPITAL_COMMUNITY): Payer: Self-pay | Admitting: *Deleted

## 2018-06-08 ENCOUNTER — Other Ambulatory Visit: Payer: Self-pay

## 2018-06-08 ENCOUNTER — Telehealth: Payer: Self-pay | Admitting: Emergency Medicine

## 2018-06-08 DIAGNOSIS — N809 Endometriosis, unspecified: Secondary | ICD-10-CM | POA: Insufficient documentation

## 2018-06-08 DIAGNOSIS — I959 Hypotension, unspecified: Secondary | ICD-10-CM | POA: Diagnosis not present

## 2018-06-08 DIAGNOSIS — G43909 Migraine, unspecified, not intractable, without status migrainosus: Secondary | ICD-10-CM | POA: Insufficient documentation

## 2018-06-08 DIAGNOSIS — J95811 Postprocedural pneumothorax: Principal | ICD-10-CM | POA: Diagnosis present

## 2018-06-08 DIAGNOSIS — Z888 Allergy status to other drugs, medicaments and biological substances status: Secondary | ICD-10-CM

## 2018-06-08 DIAGNOSIS — Z981 Arthrodesis status: Secondary | ICD-10-CM

## 2018-06-08 DIAGNOSIS — F419 Anxiety disorder, unspecified: Secondary | ICD-10-CM | POA: Insufficient documentation

## 2018-06-08 DIAGNOSIS — Z882 Allergy status to sulfonamides status: Secondary | ICD-10-CM

## 2018-06-08 DIAGNOSIS — Z79899 Other long term (current) drug therapy: Secondary | ICD-10-CM

## 2018-06-08 DIAGNOSIS — F319 Bipolar disorder, unspecified: Secondary | ICD-10-CM | POA: Diagnosis present

## 2018-06-08 DIAGNOSIS — Z8709 Personal history of other diseases of the respiratory system: Secondary | ICD-10-CM | POA: Insufficient documentation

## 2018-06-08 DIAGNOSIS — J45909 Unspecified asthma, uncomplicated: Secondary | ICD-10-CM | POA: Diagnosis present

## 2018-06-08 DIAGNOSIS — R918 Other nonspecific abnormal finding of lung field: Secondary | ICD-10-CM | POA: Insufficient documentation

## 2018-06-08 NOTE — Telephone Encounter (Signed)
There is no order that needs to be placed for this ENB. Pre-admit orders need to be placed, [facility] has to take care of this. I left a message for [place] to contact [facility] about this. Nothing further was needed at this time.

## 2018-06-10 ENCOUNTER — Ambulatory Visit (HOSPITAL_BASED_OUTPATIENT_CLINIC_OR_DEPARTMENT_OTHER)
Admission: RE | Admit: 2018-06-10 | Discharge: 2018-06-10 | Disposition: A | Discharge status: Home / Self Care | Payer: BLUE CROSS/BLUE SHIELD | Source: Ambulatory Visit | Attending: Emergency Medicine | Admitting: Emergency Medicine

## 2018-06-10 ENCOUNTER — Ambulatory Visit (HOSPITAL_COMMUNITY): Payer: BLUE CROSS/BLUE SHIELD

## 2018-06-10 ENCOUNTER — Encounter (HOSPITAL_COMMUNITY): Payer: Self-pay

## 2018-06-10 ENCOUNTER — Other Ambulatory Visit: Payer: Self-pay

## 2018-06-10 ENCOUNTER — Encounter (HOSPITAL_COMMUNITY)
Admission: RE | Disposition: A | Discharge status: Home / Self Care | Payer: Self-pay | Source: Ambulatory Visit | Attending: Emergency Medicine

## 2018-06-10 DIAGNOSIS — R9389 Abnormal findings on diagnostic imaging of other specified body structures: Secondary | ICD-10-CM

## 2018-06-10 DIAGNOSIS — R918 Other nonspecific abnormal finding of lung field: Secondary | ICD-10-CM

## 2018-06-10 DIAGNOSIS — Z8709 Personal history of other diseases of the respiratory system: Secondary | ICD-10-CM | POA: Insufficient documentation

## 2018-06-10 DIAGNOSIS — Z79899 Other long term (current) drug therapy: Secondary | ICD-10-CM | POA: Insufficient documentation

## 2018-06-10 DIAGNOSIS — F319 Bipolar disorder, unspecified: Secondary | ICD-10-CM | POA: Insufficient documentation

## 2018-06-10 DIAGNOSIS — Z9889 Other specified postprocedural states: Secondary | ICD-10-CM

## 2018-06-10 DIAGNOSIS — G43909 Migraine, unspecified, not intractable, without status migrainosus: Secondary | ICD-10-CM

## 2018-06-10 DIAGNOSIS — D869 Sarcoidosis, unspecified: Secondary | ICD-10-CM | POA: Diagnosis present

## 2018-06-10 DIAGNOSIS — Z882 Allergy status to sulfonamides status: Secondary | ICD-10-CM | POA: Insufficient documentation

## 2018-06-10 DIAGNOSIS — N809 Endometriosis, unspecified: Secondary | ICD-10-CM

## 2018-06-10 DIAGNOSIS — Z419 Encounter for procedure for purposes other than remedying health state, unspecified: Secondary | ICD-10-CM

## 2018-06-10 DIAGNOSIS — F419 Anxiety disorder, unspecified: Secondary | ICD-10-CM | POA: Insufficient documentation

## 2018-06-10 HISTORY — DX: Family history of other specified conditions: Z84.89

## 2018-06-10 HISTORY — DX: Headache: R51

## 2018-06-10 HISTORY — PX: VIDEO BRONCHOSCOPY WITH ENDOBRONCHIAL NAVIGATION: SHX6175

## 2018-06-10 HISTORY — DX: Depression, unspecified: F32.A

## 2018-06-10 HISTORY — DX: Pneumonia, unspecified organism: J18.9

## 2018-06-10 HISTORY — DX: Dyspnea, unspecified: R06.00

## 2018-06-10 HISTORY — DX: Other specified postprocedural states: Z98.890

## 2018-06-10 HISTORY — DX: Other nonspecific abnormal finding of lung field: R91.8

## 2018-06-10 HISTORY — DX: Major depressive disorder, single episode, unspecified: F32.9

## 2018-06-10 HISTORY — DX: Headache, unspecified: R51.9

## 2018-06-10 HISTORY — DX: Nausea with vomiting, unspecified: R11.2

## 2018-06-10 LAB — CBC
HEMATOCRIT: 38.5 % (ref 36.0–46.0)
Hemoglobin: 12.3 g/dL (ref 12.0–15.0)
MCH: 29.7 pg (ref 26.0–34.0)
MCHC: 31.9 g/dL (ref 30.0–36.0)
MCV: 93 fL (ref 78.0–100.0)
PLATELETS: 367 10*3/uL (ref 150–400)
RBC: 4.14 MIL/uL (ref 3.87–5.11)
RDW: 12.7 % (ref 11.5–15.5)
WBC: 4.2 10*3/uL (ref 4.0–10.5)

## 2018-06-10 LAB — APTT: APTT: 32 s (ref 24–36)

## 2018-06-10 LAB — POCT PREGNANCY, URINE: Preg Test, Ur: NEGATIVE

## 2018-06-10 LAB — PROTIME-INR
INR: 1.14
Prothrombin Time: 14.6 seconds (ref 11.4–15.2)

## 2018-06-10 SURGERY — VIDEO BRONCHOSCOPY WITH ENDOBRONCHIAL NAVIGATION
Anesthesia: General

## 2018-06-10 MED ORDER — PROPOFOL 500 MG/50ML IV EMUL
INTRAVENOUS | Status: DC | PRN
  Administered 2018-06-10: 25 ug/kg/min via INTRAVENOUS

## 2018-06-10 MED ORDER — FENTANYL CITRATE (PF) 100 MCG/2ML IJ SOLN: 25.0000 ug | INTRAMUSCULAR | Status: DC | PRN

## 2018-06-10 MED ORDER — DEXAMETHASONE SODIUM PHOSPHATE 10 MG/ML IJ SOLN
INTRAMUSCULAR | Status: DC | PRN
  Administered 2018-06-10: 10 mg via INTRAVENOUS

## 2018-06-10 MED ORDER — PROPOFOL 10 MG/ML IV BOLUS
INTRAVENOUS | Status: AC
  Filled 2018-06-10: qty 20

## 2018-06-10 MED ORDER — MIDAZOLAM HCL 5 MG/5ML IJ SOLN
INTRAMUSCULAR | Status: DC | PRN
  Administered 2018-06-10: 2 mg via INTRAVENOUS

## 2018-06-10 MED ORDER — FENTANYL CITRATE (PF) 100 MCG/2ML IJ SOLN
INTRAMUSCULAR | Status: DC | PRN
  Administered 2018-06-10: 100 ug via INTRAVENOUS

## 2018-06-10 MED ORDER — SUCCINYLCHOLINE CHLORIDE 200 MG/10ML IV SOSY
PREFILLED_SYRINGE | INTRAVENOUS | Status: AC
  Filled 2018-06-10: qty 10

## 2018-06-10 MED ORDER — SODIUM CHLORIDE 0.9 % IV SOLN
INTRAVENOUS | Status: DC | PRN
  Administered 2018-06-10: 20 ug/min via INTRAVENOUS

## 2018-06-10 MED ORDER — DIPHENHYDRAMINE HCL 50 MG/ML IJ SOLN
12.5000 mg | Freq: Once | INTRAMUSCULAR | Status: AC
  Administered 2018-06-10: 12.5 mg via INTRAVENOUS
  Filled 2018-06-10: qty 1
  Filled 2018-06-10: qty 0.25

## 2018-06-10 MED ORDER — LACTATED RINGERS IV SOLN
INTRAVENOUS | Status: DC
  Administered 2018-06-10: 09:00:00 via INTRAVENOUS

## 2018-06-10 MED ORDER — ONDANSETRON HCL 4 MG/2ML IJ SOLN
INTRAMUSCULAR | Status: DC | PRN
  Administered 2018-06-10: 4 mg via INTRAVENOUS

## 2018-06-10 MED ORDER — MIDAZOLAM HCL 2 MG/2ML IJ SOLN
INTRAMUSCULAR | Status: AC
  Filled 2018-06-10: qty 2

## 2018-06-10 MED ORDER — SCOPOLAMINE 1 MG/3DAYS TD PT72
1.0000 | MEDICATED_PATCH | TRANSDERMAL | Status: DC
  Administered 2018-06-10: 1.5 mg via TRANSDERMAL
  Filled 2018-06-10: qty 1

## 2018-06-10 MED ORDER — LIDOCAINE HCL (CARDIAC) PF 100 MG/5ML IV SOSY
PREFILLED_SYRINGE | INTRAVENOUS | Status: DC | PRN
  Administered 2018-06-10: 100 mg via INTRAVENOUS

## 2018-06-10 MED ORDER — EPHEDRINE 5 MG/ML INJ
INTRAVENOUS | Status: AC
  Filled 2018-06-10: qty 10

## 2018-06-10 MED ORDER — GABAPENTIN 600 MG PO TABS: 600.0000 mg | ORAL_TABLET | Freq: Two times a day (BID) | ORAL | Status: DC

## 2018-06-10 MED ORDER — PHENYLEPHRINE 40 MCG/ML (10ML) SYRINGE FOR IV PUSH (FOR BLOOD PRESSURE SUPPORT)
PREFILLED_SYRINGE | INTRAVENOUS | Status: DC | PRN
  Administered 2018-06-10: 40 ug via INTRAVENOUS
  Administered 2018-06-10: 160 ug via INTRAVENOUS
  Administered 2018-06-10 (×2): 80 ug via INTRAVENOUS

## 2018-06-10 MED ORDER — FENTANYL CITRATE (PF) 250 MCG/5ML IJ SOLN
INTRAMUSCULAR | Status: AC
  Filled 2018-06-10: qty 5

## 2018-06-10 MED ORDER — SUCCINYLCHOLINE CHLORIDE 200 MG/10ML IV SOSY: PREFILLED_SYRINGE | INTRAVENOUS | Status: DC | PRN

## 2018-06-10 MED ORDER — ONDANSETRON HCL 4 MG/2ML IJ SOLN
INTRAMUSCULAR | Status: AC
  Filled 2018-06-10: qty 2

## 2018-06-10 MED ORDER — PROPOFOL 10 MG/ML IV BOLUS
INTRAVENOUS | Status: DC | PRN
  Administered 2018-06-10: 130 mg via INTRAVENOUS

## 2018-06-10 MED ORDER — ROCURONIUM BROMIDE 10 MG/ML (PF) SYRINGE
PREFILLED_SYRINGE | INTRAVENOUS | Status: DC | PRN
  Administered 2018-06-10: 50 mg via INTRAVENOUS

## 2018-06-10 MED ORDER — GLYCOPYRROLATE PF 0.2 MG/ML IJ SOSY
PREFILLED_SYRINGE | INTRAMUSCULAR | Status: DC | PRN
  Administered 2018-06-10: 0.6 mg via INTRAVENOUS

## 2018-06-10 MED ORDER — DIPHENHYDRAMINE HCL 50 MG/ML IJ SOLN
12.5000 mg | Freq: Once | INTRAMUSCULAR | Status: AC
  Administered 2018-06-10: 12.5 mg via INTRAVENOUS
  Filled 2018-06-10: qty 0.25

## 2018-06-10 MED ORDER — PROMETHAZINE HCL 25 MG/ML IJ SOLN: 6.2500 mg | INTRAMUSCULAR | Status: DC | PRN

## 2018-06-10 MED ORDER — 0.9 % SODIUM CHLORIDE (POUR BTL) OPTIME
TOPICAL | Status: DC | PRN
  Administered 2018-06-10: 1000 mL

## 2018-06-10 MED ORDER — NEOSTIGMINE METHYLSULFATE 5 MG/5ML IV SOSY
PREFILLED_SYRINGE | INTRAVENOUS | Status: DC | PRN
  Administered 2018-06-10: 4 mg via INTRAVENOUS

## 2018-06-10 SURGICAL SUPPLY — 43 items
ADAPTER BRONCHOSCOPE OLYMPUS (ADAPTER) ×6 IMPLANT
ADAPTER VALVE BIOPSY EBUS (MISCELLANEOUS) IMPLANT
ADPTR VALVE BIOPSY EBUS (MISCELLANEOUS)
BRUSH CYTOL CELLEBRITY 1.5X140 (MISCELLANEOUS) ×3 IMPLANT
BRUSH SUPERTRAX BIOPSY (INSTRUMENTS) ×3 IMPLANT
BRUSH SUPERTRAX NDL-TIP CYTO (INSTRUMENTS) ×6 IMPLANT
CANISTER SUCT 3000ML PPV (MISCELLANEOUS) ×3 IMPLANT
CHANNEL WORK EXTEND EDGE 180 (KITS) IMPLANT
CHANNEL WORK EXTEND EDGE 90 (KITS) IMPLANT
CONT SPEC 4OZ CLIKSEAL STRL BL (MISCELLANEOUS) ×3 IMPLANT
COVER BACK TABLE 60X90IN (DRAPES) ×3 IMPLANT
COVER WAND RF STERILE (DRAPES) ×3 IMPLANT
FILTER STRAW FLUID ASPIR (MISCELLANEOUS) IMPLANT
FORCEPS BIOP SUPERTRX PREMAR (INSTRUMENTS) ×6 IMPLANT
GAUZE SPONGE 4X4 12PLY STRL (GAUZE/BANDAGES/DRESSINGS) ×3 IMPLANT
GLOVE BIO SURGEON STRL SZ7.5 (GLOVE) ×6 IMPLANT
GOWN STRL REUS W/ TWL LRG LVL3 (GOWN DISPOSABLE) ×2 IMPLANT
GOWN STRL REUS W/TWL LRG LVL3 (GOWN DISPOSABLE) ×4
KIT CLEAN ENDO COMPLIANCE (KITS) ×3 IMPLANT
KIT ENDOBRONCHIAL EDGE FIRM (KITS) IMPLANT
KIT LOCATABLE GUIDE (CANNULA) IMPLANT
KIT MARKER FIDUCIAL DELIVERY (KITS) IMPLANT
KIT PROCEDURE EDGE 180 (KITS) ×3 IMPLANT
KIT PROCEDURE EDGE 90 (KITS) IMPLANT
KIT TURNOVER KIT B (KITS) ×3 IMPLANT
MARKER SKIN DUAL TIP RULER LAB (MISCELLANEOUS) ×3 IMPLANT
NEEDLE SUPERTRX PREMARK BIOPSY (NEEDLE) ×3 IMPLANT
NS IRRIG 1000ML POUR BTL (IV SOLUTION) ×3 IMPLANT
OIL SILICONE PENTAX (PARTS (SERVICE/REPAIRS)) ×3 IMPLANT
PAD ARMBOARD 7.5X6 YLW CONV (MISCELLANEOUS) ×6 IMPLANT
PATCHES PATIENT (LABEL) ×9 IMPLANT
SYR 20CC LL (SYRINGE) ×3 IMPLANT
SYR 20ML ECCENTRIC (SYRINGE) ×3 IMPLANT
SYR 50ML SLIP (SYRINGE) ×3 IMPLANT
TOWEL OR 17X24 6PK STRL BLUE (TOWEL DISPOSABLE) ×3 IMPLANT
TRAP SPECIMEN MUCOUS 40CC (MISCELLANEOUS) IMPLANT
TUBE CONNECTING 20'X1/4 (TUBING) ×1
TUBE CONNECTING 20X1/4 (TUBING) ×2 IMPLANT
UNDERPAD 30X30 (UNDERPADS AND DIAPERS) ×3 IMPLANT
VALVE BIOPSY  SINGLE USE (MISCELLANEOUS) ×6
VALVE BIOPSY SINGLE USE (MISCELLANEOUS) ×3 IMPLANT
VALVE SUCTION BRONCHIO DISP (MISCELLANEOUS) ×3 IMPLANT
WATER STERILE IRR 1000ML POUR (IV SOLUTION) ×3 IMPLANT

## 2018-06-10 NOTE — Op Note (Signed)
Video Bronchoscopy with Electromagnetic Navigation Procedure Note  Date of Operation: 06/10/2018  Pre-op Diagnosis: Left lower lobe opacity  Post-op Diagnosis: Same  Surgeon: Baltazar Apo  Assistants: None  Anesthesia: General endotracheal anesthesia  Operation: Flexible video fiberoptic bronchoscopy with electromagnetic navigation and biopsies.  Estimated Blood Loss: Minimal  Complications: None apparent  Indications and History: Kathy Welch is a 32 y.o. female with history of endometriosis.  She was found to have an enlarging left lower lobe groundglass, partially solid opacity of unclear etiology.  Recommendation was made to achieve a tissue diagnosis via navigational bronchoscopy.  The risks, benefits, complications, treatment options and expected outcomes were discussed with the patient.  The possibilities of pneumothorax, pneumonia, reaction to medication, pulmonary aspiration, perforation of a viscus, bleeding, failure to diagnose a condition and creating a complication requiring transfusion or operation were discussed with the patient who freely signed the consent.    Description of Procedure: The patient was seen in the Preoperative Area, was examined and was deemed appropriate to proceed.  The patient was taken to OR 10, identified as Kathy Welch and the procedure verified as Flexible Video Fiberoptic Bronchoscopy.  A Time Out was held and the above information confirmed.   Prior to the date of the procedure a high-resolution CT scan of the chest was performed. Utilizing [place] a virtual tracheobronchial tree was generated to allow the creation of distinct navigation pathways to the patient's parenchymal abnormalities. After being taken to the operating room general anesthesia was initiated and the patient  was orally intubated. The video fiberoptic bronchoscope was introduced via the endotracheal tube and a general inspection was performed which  showed normal airways throughout.  There were no endobronchial lesions or abnormal secretions seen. The extendable working channel and locator guide were introduced into the bronchoscope. The distinct navigation pathways prepared prior to this procedure were then utilized to navigate to within 0.6 cm of patient's left lower lobe opacity identified on CT scan. The extendable working channel was secured into place and the locator guide was withdrawn. Under fluoroscopic guidance transbronchial needle brushings and transbronchial forceps biopsies were performed to be sent for cytology and pathology. A bronchioalveolar lavage was performed in the left lower lobe and sent for cytology and microbiology (bacterial, fungal, AFB smears and cultures). At the end of the procedure a general airway inspection was performed and there was no evidence of active bleeding. The bronchoscope was removed.  The patient tolerated the procedure well. There was no significant blood loss and there were no obvious complications. A post-procedural chest x-ray is pending.  Samples: 1. Transbronchial needle brushings from LLL opacity 2. Transbronchial forceps biopsies from LLL opacity 3. Bronchoalveolar lavage from LLL  Plans:  The patient will be discharged from the PACU to home when recovered from anesthesia and after chest x-ray is reviewed. We will review the cytology, pathology and microbiology results with the patient when they become available. Outpatient followup will be with Dr Lamonte Sakai.    Baltazar Apo, MD, PhD 06/10/2018, 12:07 PM [facility] [phone] or if no answer [phone]

## 2018-06-10 NOTE — Progress Notes (Signed)
Patient complains of severe itching after wiping down with CHG wipes.  Patient provided wet washcloth to wipe off.  Still having itching.  Received order from Dr. Gifford Shave for 12.5mg  Benadryl IV.  Added CHG to patient's allergy list.

## 2018-06-10 NOTE — Anesthesia Procedure Notes (Addendum)
Procedure Name: Intubation Date/Time: 06/10/2018 10:56 AM Performed by: Carney Living, CRNA Pre-anesthesia Checklist: Patient identified, Emergency Drugs available, Suction available and Patient being monitored Patient Re-evaluated:Patient Re-evaluated prior to induction Oxygen Delivery Method: Circle System Utilized Preoxygenation: Pre-oxygenation with 100% oxygen Induction Type: IV induction Ventilation: Mask ventilation without difficulty Laryngoscope Size: Mac and 3 Grade View: Grade I Tube type: Oral Number of attempts: 1 Airway Equipment and Method: Stylet and Oral airway Placement Confirmation: ETT inserted through vocal cords under direct vision,  positive ETCO2 and breath sounds checked- equal and bilateral Secured at: 22 cm Tube secured with: Tape Dental Injury: Teeth and Oropharynx as per pre-operative assessment  Comments: Intubation performed by Newman Nickels, SRNA

## 2018-06-10 NOTE — Interval H&P Note (Signed)
PCCM Interval Note  Patient presents today for further evaluation of her left lower lobe opacity.  No new issues reported although she still is having rash.  She is feeling well, continues to work.  CBC    Component Value Date/Time   WBC 7.7 04/30/2018 1637   RBC 3.97 04/30/2018 1637   HGB 11.9 (L) 04/30/2018 1637   HCT 35.6 04/30/2018 1637   PLT 397 04/30/2018 1637   MCV 89.7 04/30/2018 1637   MCH 30.1 04/30/2018 1637   MCHC 33.5 04/30/2018 1637   RDW 13.3 04/30/2018 1637   LYMPHSABS 2.6 04/30/2018 1637   MONOABS 0.8 04/30/2018 1637   EOSABS 0.4 04/30/2018 1637   BASOSABS 0.1 04/30/2018 1637    Plan to perform navigational bronchoscopy to allow sampling of the left lower lobe opacity.  Risks and benefits discussed with the patient and she fully understands.  All questions answered.  She elects to proceed.  No barriers identified.  Baltazar Apo, MD, PhD 06/10/2018, 8:44 AM [facility] [phone] or if no answer [phone]

## 2018-06-10 NOTE — Discharge Instructions (Signed)
Flexible Bronchoscopy, Care After These instructions give you information on caring for yourself after your procedure. Your doctor may also give you more specific instructions. Call your doctor if you have any problems or questions after your procedure. Follow these instructions at home:  Do not eat or drink anything for 2 hours after your procedure. If you try to eat or drink before the medicine wears off, food or drink could go into your lungs. You could also burn yourself.  After 2 hours have passed and when you can cough and gag normally, you may eat soft food and drink liquids slowly.  The day after the test, you may eat your normal diet.  You may do your normal activities.  Keep all doctor visits. Get help right away if:  You get more and more short of breath.  You get light-headed.  You feel like you are going to pass out (faint).  You have chest pain.  You have new problems that worry you.  You cough up more than a little blood.  You cough up more blood than before.   Please call our office for any questions or concerns.  [phone].  This information is not intended to replace advice given to you by your health care provider. Make sure you discuss any questions you have with your health care provider. Document Released: 06/23/2009 Document Revised: 02/01/2016 Document Reviewed: 04/30/2013 Elsevier Interactive Patient Education  2017 [employer].

## 2018-06-10 NOTE — Anesthesia Preprocedure Evaluation (Addendum)
Anesthesia Evaluation  Patient identified by MRN, date of birth, ID band Patient awake    Reviewed: Allergy & Precautions, NPO status , Patient's Chart, lab work & pertinent test results  History of Anesthesia Complications (+) PONV, Family history of anesthesia reaction and history of anesthetic complications  Airway Mallampati: I  TM Distance: >3 FB Neck ROM: Full    Dental  (+) Teeth Intact, Dental Advisory Given   Pulmonary asthma ,  LEFT LUNG MASS   Pulmonary exam normal breath sounds clear to auscultation       Cardiovascular Exercise Tolerance: Good negative cardio ROS Normal cardiovascular exam Rhythm:Regular Rate:Normal     Neuro/Psych  Headaches, Seizures -,  PSYCHIATRIC DISORDERS Anxiety Depression Bipolar Disorder    GI/Hepatic negative GI ROS, Neg liver ROS,   Endo/Other  negative endocrine ROS  Renal/GU negative Renal ROS     Musculoskeletal negative musculoskeletal ROS (+)   Abdominal   Peds  Hematology negative hematology ROS (+)   Anesthesia Other Findings Day of surgery medications reviewed with the patient.  Reproductive/Obstetrics                            Anesthesia Physical Anesthesia Plan  ASA: II  Anesthesia Plan: General   Post-op Pain Management:    Induction: Intravenous  PONV Risk Score and Plan: 4 or greater and Ondansetron, Dexamethasone, Midazolam, Scopolamine patch - Pre-op, Diphenhydramine and Propofol infusion  Airway Management Planned: Oral ETT  Additional Equipment:   Intra-op Plan:   Post-operative Plan: Extubation in OR  Informed Consent: I have reviewed the patients History and Physical, chart, labs and discussed the procedure including the risks, benefits and alternatives for the proposed anesthesia with the patient or authorized representative who has indicated his/her understanding and acceptance.   Dental advisory given  Plan  Discussed with: CRNA, Anesthesiologist and Surgeon  Anesthesia Plan Comments:     Anesthesia Quick Evaluation

## 2018-06-10 NOTE — Transfer of Care (Signed)
Immediate Anesthesia Transfer of Care Note  Patient: Kathy Welch  Procedure(s) Performed: VIDEO BRONCHOSCOPY WITH ENDOBRONCHIAL NAVIGATION (N/A )  Patient Location: PACU  Anesthesia Type:General  Level of Consciousness: awake, alert , oriented and patient cooperative  Airway & Oxygen Therapy: Patient Spontanous Breathing and Patient connected to nasal cannula oxygen  Post-op Assessment: Report given to RN, Post -op Vital signs reviewed and stable and Patient moving all extremities X 4  Post vital signs: Reviewed and stable  Last Vitals:  Vitals Value Taken Time  BP 107/75 06/10/2018 12:25 PM  Temp    Pulse 110 06/10/2018 12:25 PM  Resp 20 06/10/2018 12:25 PM  SpO2 100 % 06/10/2018 12:25 PM  Vitals shown include unvalidated device data.  Last Pain:  Vitals:   06/10/18 0909  TempSrc:   PainSc: 0-No pain         Complications: No apparent anesthesia complications

## 2018-06-11 ENCOUNTER — Ambulatory Visit (INDEPENDENT_AMBULATORY_CARE_PROVIDER_SITE_OTHER)
Admission: RE | Admit: 2018-06-11 | Discharge: 2018-06-11 | Disposition: A | Discharge status: Home / Self Care | Payer: BLUE CROSS/BLUE SHIELD | Source: Ambulatory Visit | Attending: Primary Care | Admitting: Primary Care

## 2018-06-11 ENCOUNTER — Inpatient Hospital Stay (HOSPITAL_COMMUNITY): Payer: BLUE CROSS/BLUE SHIELD

## 2018-06-11 ENCOUNTER — Inpatient Hospital Stay (HOSPITAL_COMMUNITY)
Admission: AD | Admit: 2018-06-11 | Discharge: 2018-06-13 | DRG: 201 | Disposition: A | Discharge status: Home / Self Care | Payer: BLUE CROSS/BLUE SHIELD | Source: Ambulatory Visit | Attending: Pulmonary Disease | Admitting: Pulmonary Disease

## 2018-06-11 ENCOUNTER — Telehealth: Payer: Self-pay | Admitting: Emergency Medicine

## 2018-06-11 ENCOUNTER — Ambulatory Visit: Payer: BLUE CROSS/BLUE SHIELD | Admitting: Primary Care

## 2018-06-11 ENCOUNTER — Encounter (HOSPITAL_COMMUNITY): Payer: Self-pay

## 2018-06-11 ENCOUNTER — Encounter: Payer: Self-pay | Admitting: Primary Care

## 2018-06-11 ENCOUNTER — Other Ambulatory Visit: Payer: Self-pay

## 2018-06-11 VITALS — BP 110/80 | HR 66 | Temp 98.4°F | Ht 62.0 in | Wt 156.0 lb

## 2018-06-11 DIAGNOSIS — Z888 Allergy status to other drugs, medicaments and biological substances status: Secondary | ICD-10-CM | POA: Diagnosis not present

## 2018-06-11 DIAGNOSIS — R079 Chest pain, unspecified: Secondary | ICD-10-CM

## 2018-06-11 DIAGNOSIS — N809 Endometriosis, unspecified: Secondary | ICD-10-CM | POA: Diagnosis not present

## 2018-06-11 DIAGNOSIS — Z9889 Other specified postprocedural states: Secondary | ICD-10-CM | POA: Diagnosis not present

## 2018-06-11 DIAGNOSIS — J45909 Unspecified asthma, uncomplicated: Secondary | ICD-10-CM | POA: Diagnosis present

## 2018-06-11 DIAGNOSIS — Z8709 Personal history of other diseases of the respiratory system: Secondary | ICD-10-CM | POA: Diagnosis not present

## 2018-06-11 DIAGNOSIS — J939 Pneumothorax, unspecified: Secondary | ICD-10-CM | POA: Diagnosis present

## 2018-06-11 DIAGNOSIS — Z882 Allergy status to sulfonamides status: Secondary | ICD-10-CM | POA: Diagnosis not present

## 2018-06-11 DIAGNOSIS — R918 Other nonspecific abnormal finding of lung field: Secondary | ICD-10-CM

## 2018-06-11 DIAGNOSIS — R9389 Abnormal findings on diagnostic imaging of other specified body structures: Secondary | ICD-10-CM | POA: Diagnosis not present

## 2018-06-11 DIAGNOSIS — J95811 Postprocedural pneumothorax: Secondary | ICD-10-CM | POA: Diagnosis present

## 2018-06-11 DIAGNOSIS — F319 Bipolar disorder, unspecified: Secondary | ICD-10-CM | POA: Diagnosis present

## 2018-06-11 DIAGNOSIS — Z9689 Presence of other specified functional implants: Secondary | ICD-10-CM | POA: Diagnosis not present

## 2018-06-11 DIAGNOSIS — R071 Chest pain on breathing: Secondary | ICD-10-CM | POA: Diagnosis not present

## 2018-06-11 DIAGNOSIS — F419 Anxiety disorder, unspecified: Secondary | ICD-10-CM | POA: Diagnosis not present

## 2018-06-11 DIAGNOSIS — D869 Sarcoidosis, unspecified: Secondary | ICD-10-CM | POA: Diagnosis present

## 2018-06-11 DIAGNOSIS — R0781 Pleurodynia: Secondary | ICD-10-CM | POA: Diagnosis not present

## 2018-06-11 DIAGNOSIS — R0602 Shortness of breath: Secondary | ICD-10-CM | POA: Diagnosis not present

## 2018-06-11 DIAGNOSIS — I959 Hypotension, unspecified: Secondary | ICD-10-CM | POA: Diagnosis not present

## 2018-06-11 DIAGNOSIS — G43909 Migraine, unspecified, not intractable, without status migrainosus: Secondary | ICD-10-CM | POA: Diagnosis not present

## 2018-06-11 DIAGNOSIS — Z4682 Encounter for fitting and adjustment of non-vascular catheter: Secondary | ICD-10-CM

## 2018-06-11 DIAGNOSIS — Z981 Arthrodesis status: Secondary | ICD-10-CM | POA: Diagnosis not present

## 2018-06-11 DIAGNOSIS — Z79899 Other long term (current) drug therapy: Secondary | ICD-10-CM | POA: Diagnosis not present

## 2018-06-11 LAB — ACID FAST SMEAR (AFB, MYCOBACTERIA): Acid Fast Smear: NEGATIVE

## 2018-06-11 LAB — MRSA PCR SCREENING: MRSA BY PCR: NEGATIVE

## 2018-06-11 LAB — ACID FAST SMEAR (AFB)

## 2018-06-11 MED ORDER — SODIUM CHLORIDE 0.9 % IV BOLUS
1000.0000 mL | Freq: Once | INTRAVENOUS | Status: AC
  Administered 2018-06-11: 1000 mL via INTRAVENOUS

## 2018-06-11 MED ORDER — FENTANYL CITRATE (PF) 100 MCG/2ML IJ SOLN
INTRAMUSCULAR | Status: AC
  Filled 2018-06-11: qty 2

## 2018-06-11 MED ORDER — SODIUM CHLORIDE 0.9 % IV SOLN: 250.0000 mL | INTRAVENOUS | Status: DC | PRN

## 2018-06-11 MED ORDER — SODIUM CHLORIDE 0.9% FLUSH: 3.0000 mL | INTRAVENOUS | Status: DC | PRN

## 2018-06-11 MED ORDER — KETOROLAC TROMETHAMINE 15 MG/ML IJ SOLN
15.0000 mg | Freq: Four times a day (QID) | INTRAMUSCULAR | Status: DC | PRN
  Administered 2018-06-11 – 2018-06-13 (×6): 15 mg via INTRAVENOUS
  Filled 2018-06-11 (×6): qty 1

## 2018-06-11 MED ORDER — ALBUTEROL SULFATE (2.5 MG/3ML) 0.083% IN NEBU: 2.5000 mg | INHALATION_SOLUTION | RESPIRATORY_TRACT | Status: DC | PRN

## 2018-06-11 MED ORDER — SODIUM CHLORIDE 0.9% FLUSH
3.0000 mL | Freq: Two times a day (BID) | INTRAVENOUS | Status: DC
  Administered 2018-06-11 – 2018-06-13 (×3): 3 mL via INTRAVENOUS

## 2018-06-11 MED ORDER — ZOLPIDEM TARTRATE 5 MG PO TABS
5.0000 mg | ORAL_TABLET | Freq: Once | ORAL | Status: AC
  Administered 2018-06-11: 5 mg via ORAL
  Filled 2018-06-11: qty 1

## 2018-06-11 MED ORDER — FENTANYL CITRATE (PF) 100 MCG/2ML IJ SOLN: 25.0000 ug | INTRAMUSCULAR | Status: DC | PRN

## 2018-06-11 MED ORDER — FENTANYL CITRATE (PF) 100 MCG/2ML IJ SOLN
INTRAMUSCULAR | Status: AC
  Administered 2018-06-11: 100 ug
  Filled 2018-06-11: qty 4

## 2018-06-11 MED ORDER — DOCUSATE SODIUM 100 MG PO CAPS
100.0000 mg | ORAL_CAPSULE | Freq: Two times a day (BID) | ORAL | Status: DC
  Administered 2018-06-12 – 2018-06-13 (×3): 100 mg via ORAL
  Filled 2018-06-11 (×4): qty 1

## 2018-06-11 MED ORDER — ONDANSETRON HCL 4 MG/2ML IJ SOLN
4.0000 mg | Freq: Four times a day (QID) | INTRAMUSCULAR | Status: DC | PRN
  Administered 2018-06-12 – 2018-06-13 (×3): 4 mg via INTRAVENOUS
  Filled 2018-06-11 (×3): qty 2

## 2018-06-11 MED ORDER — MIDAZOLAM HCL 2 MG/2ML IJ SOLN
INTRAMUSCULAR | Status: AC
  Administered 2018-06-11: 2 mg
  Filled 2018-06-11: qty 4

## 2018-06-11 MED ORDER — ONDANSETRON HCL 4 MG PO TABS: 4.0000 mg | ORAL_TABLET | Freq: Four times a day (QID) | ORAL | Status: DC | PRN

## 2018-06-11 MED ORDER — SODIUM CHLORIDE 0.9% FLUSH
3.0000 mL | Freq: Two times a day (BID) | INTRAVENOUS | Status: DC
  Administered 2018-06-11 – 2018-06-13 (×5): 3 mL via INTRAVENOUS

## 2018-06-11 MED ORDER — MIDAZOLAM HCL 2 MG/2ML IJ SOLN
1.0000 mg | INTRAMUSCULAR | Status: DC | PRN
  Filled 2018-06-11: qty 2

## 2018-06-11 MED ORDER — FENTANYL CITRATE (PF) 100 MCG/2ML IJ SOLN
50.0000 ug | Freq: Once | INTRAMUSCULAR | Status: AC | PRN
  Administered 2018-06-11: 50 ug via INTRAVENOUS

## 2018-06-11 MED ORDER — MIDAZOLAM HCL 2 MG/2ML IJ SOLN
2.0000 mg | Freq: Once | INTRAMUSCULAR | Status: AC | PRN
  Administered 2018-06-11: 2 mg via INTRAVENOUS

## 2018-06-11 MED ORDER — HYDROCODONE-ACETAMINOPHEN 5-325 MG PO TABS
1.0000 | ORAL_TABLET | ORAL | Status: DC | PRN
  Administered 2018-06-11: 2 via ORAL
  Administered 2018-06-11: 1 via ORAL
  Administered 2018-06-12 – 2018-06-13 (×5): 2 via ORAL
  Filled 2018-06-11 (×6): qty 2
  Filled 2018-06-11: qty 1

## 2018-06-11 MED ORDER — SENNA 8.6 MG PO TABS
1.0000 | ORAL_TABLET | Freq: Two times a day (BID) | ORAL | Status: DC
  Administered 2018-06-12 – 2018-06-13 (×2): 8.6 mg via ORAL
  Filled 2018-06-11 (×4): qty 1

## 2018-06-11 NOTE — Progress Notes (Signed)
eLink Physician-Brief Progress Note Patient Name: Kathy Welch DOB: Apr 06, 1986 MRN: [id number]   Date of Service  06/11/2018  HPI/Events of Note  Multiple issues: 1. Hypotension - BP = 88/47 and 2. Patient requests a sleeping aid.   eICU Interventions  Will order: 1. Bolus with 0.9 NaCl 1 liter IV over 1 hour now.  2. Ambien 5 mg PO now.      Intervention Category Major Interventions: Hypotension - evaluation and management;Other:  [name] Cornelia Copa 06/11/2018, 10:15 PM

## 2018-06-11 NOTE — Anesthesia Postprocedure Evaluation (Signed)
Anesthesia Post Note  Patient: Kathy Welch  Procedure(s) Performed: VIDEO BRONCHOSCOPY WITH ENDOBRONCHIAL NAVIGATION (N/A )     Patient location during evaluation: PACU Anesthesia Type: General Level of consciousness: awake, awake and alert and oriented Pain management: pain level controlled Vital Signs Assessment: post-procedure vital signs reviewed and stable Respiratory status: spontaneous breathing, nonlabored ventilation and respiratory function stable Cardiovascular status: stable Postop Assessment: no signs of nausea or vomiting Anesthetic complications: no    Last Vitals:  Vitals:   06/10/18 1255 06/10/18 1304  BP: 112/73 113/69  Pulse: 96 95  Resp: 13 17  Temp: (!) 36.3 C   SpO2: 100% 100%    Last Pain:  Vitals:   06/10/18 1304  TempSrc:   PainSc: 0-No pain                 Catalina Gravel

## 2018-06-11 NOTE — Progress Notes (Signed)
@Patient  ID: Kathy Welch, female    DOB: 24-May-1986, 32 y.o.   MRN: [id number]  Chief Complaint  Patient presents with  . Acute Visit    Had a bronch yesterday, started having chest pain and SOB, coughing up blood. Pain on left side radiating into her back and has started wheezing.     Referring provider: Kirk Ruths, MD  HPI: 32 year old female, never smoked. PMH bipolar disorder, endometriosis, asthma, abnormal chest CT, left lower lobe infiltrate. CT Super D CHEST on 05/26/18 showed 1. 3 cm multi lobular mixed attenuation lesion posterior left lower lobe, similar to prior study. Patient had bronchoscopy by Dr. Lamonte Sakai on 06/11/18.   06/04/18- OV/ Dr. Lamonte Sakai --this is a follow-up visit for 32 year old woman with suspected asthma and an abnormal CT scan of the chest with a groundglass left basilar opacity first noted by CT 07/2016 and seen again on abdominal CT last month.  We repeated her chest CT on 05/26/2018 and I have reviewed, confirms the persistence of a mixed density left lower lobe groundglass opacity measuring 2.9 x 3.3 x 1.6 cm.  I do not see any other nodular opacities.  She also underwent pulmonary function testing today which shows normal airflows without a bronchodilator response, some flattening of the inspiratory loop of her flow volume loop consistent with possible variable upper airway obstruction.  Her lung volumes were normal and her diffusion capacity is normal.    06/11/2018 Patient presents today for acute visit with chest pain and pain with breathing. Shortness of breath since last night. States that arund 8pm she developed severe chest pain.  Mid-chest radiating to back. Can't take deep breath.  Some hemoptysis, small amount. Rattling in chest. VSS. Afebrile. 02 sat 98% RA. Stat CXR today showed new 40-50% left-sided pneumothorax. No mediastinal shift. Dr. Lamonte Sakai in exam room to speak with patient. Plan is to pre-admit patient to [facility] step down bed for  pigtail chest tube insertion.      Allergies  Allergen Reactions  . Sulfa Antibiotics Hives  . Chlorhexidine Gluconate Itching    Topical on skin     There is no immunization history on file for this patient.  Past Medical History:  Diagnosis Date  . Anxiety   . Asthma    as a child  . Bipolar disorder ([place])    denies  . Complication of anesthesia    nausea  . Depression   . Dyspnea    with exertion , shortness of breath while sitting and speaking  . Family history of adverse reaction to anesthesia    Mother - nausea  . Headache    Migraines  . Left lower lobe pulmonary infiltrate 06/10/2018  . Pneumonia   . PONV (postoperative nausea and vomiting)    Nausea, does well with patch  . Seizures ([place])    "at age 55 after giving blood"  . UTI (lower urinary tract infection)     Tobacco History: Social History   Tobacco Use  Smoking Status Never Smoker  Smokeless Tobacco Never Used   Counseling given: Not Answered   Outpatient Medications Prior to Visit  Medication Sig Dispense Refill  . ALPRAZolam (XANAX) 0.5 MG tablet Take 0.5 mg by mouth daily as needed for anxiety.     Marland Kitchen buPROPion (WELLBUTRIN SR) 150 MG 12 hr tablet Take 150 mg by mouth 2 (two) times daily.    . cetirizine (ZYRTEC) 10 MG tablet Take 10 mg by mouth daily.     Marland Kitchen  etonogestrel (NEXPLANON) 68 MG IMPL implant 1 each by Subdermal route once.    . gabapentin (NEURONTIN) 600 MG tablet Take 1 tablet (600 mg total) by mouth 2 (two) times daily.    . hydrOXYzine (ATARAX/VISTARIL) 25 MG tablet Take 25 mg by mouth at bedtime.     Marland Kitchen PRESCRIPTION MEDICATION Apply 1 application topically daily. Triamcinolone mixed with Cerave    . rizatriptan (MAXALT) 10 MG tablet Take 10 mg by mouth as needed for migraine. May repeat in 2 hours if needed     No facility-administered medications prior to visit.     Review of Systems  Review of Systems  Constitutional: Negative.   HENT: Negative.   Respiratory:  Positive for cough and shortness of breath. Negative for wheezing and stridor.   Cardiovascular: Positive for chest pain.    Physical Exam  BP 110/80   Pulse 66   Temp 98.4 F (36.9 C) (Oral)   Ht 5\' 2"  (1.575 m)   Wt 156 lb (70.8 kg)   LMP 05/24/2018   SpO2 98%   BMI 28.53 kg/m  Physical Exam  Constitutional: She is oriented to person, place, and time. She appears well-developed and well-nourished.  Healthy adult female, no acute distress   HENT:  Head: Normocephalic and atraumatic.  Eyes: Pupils are equal, round, and reactive to light. EOM are normal.  Neck: Normal range of motion. Neck supple.  Cardiovascular: Normal rate, regular rhythm and normal heart sounds.  No murmur heard. Pulmonary/Chest: Effort normal and breath sounds normal. No respiratory distress. She has no wheezes.  LS diminished   Abdominal: Soft. Bowel sounds are normal. There is no tenderness.  Neurological: She is alert and oriented to person, place, and time.  Skin: Skin is warm and dry. No rash noted. No erythema.  Psychiatric: She has a normal mood and affect. Her behavior is normal. Judgment normal.     Lab Results:  CBC    Component Value Date/Time   WBC 4.2 06/10/2018 0906   RBC 4.14 06/10/2018 0906   HGB 12.3 06/10/2018 0906   HCT 38.5 06/10/2018 0906   PLT 367 06/10/2018 0906   MCV 93.0 06/10/2018 0906   MCH 29.7 06/10/2018 0906   MCHC 31.9 06/10/2018 0906   RDW 12.7 06/10/2018 0906   LYMPHSABS 2.6 04/30/2018 1637   MONOABS 0.8 04/30/2018 1637   EOSABS 0.4 04/30/2018 1637   BASOSABS 0.1 04/30/2018 1637    BMET    Component Value Date/Time   NA 137 04/30/2018 1637   K 3.6 04/30/2018 1637   CL 104 04/30/2018 1637   CO2 28 04/30/2018 1637   GLUCOSE 106 (H) 04/30/2018 1637   BUN 13 04/30/2018 1637   CREATININE 0.77 04/30/2018 1637   CALCIUM 8.9 04/30/2018 1637   GFRNONAA >60 04/30/2018 1637   GFRAA >60 04/30/2018 1637    BNP No results found for: BNP  ProBNP No results  found for: PROBNP  Imaging: Dg Chest 2 View  Result Date: 06/11/2018 CLINICAL DATA:  Sub sternal chest pain radiating toward the left. The patient underwent bronchoscopy yesterday. History of asthma EXAM: CHEST - 2 VIEW COMPARISON:  There is an approximately 35-40% left-sided pneumothorax. FINDINGS: There is a new approximately 40% left-sided pneumothorax. There is no mediastinal shift. There is no significant pleural effusion. The right lung is clear. The heart and pulmonary vascularity are normal. The bony thorax is unremarkable. IMPRESSION: New 40-50% left-sided pneumothorax. No mediastinal shift. These results were called by telephone at the  time of interpretation on 06/11/2018 at 11:27 am to [facility], NP, who verbally acknowledged these results. Electronically Signed   By: David  Martinique M.D.   On: 06/11/2018 11:34   Dg Chest Port 1 View  Result Date: 06/10/2018 CLINICAL DATA:  Post bronchoscopy with bronchoscopic biopsy. EXAM: PORTABLE CHEST 1 VIEW COMPARISON:  CT chest 05/26/2018. FINDINGS: Cardiomediastinal silhouette unremarkable. No evidence of pneumothorax or pneumomediastinum post bronchoscopy. Patchy opacities deep in the MEDIAL LEFT LOWER LOBE as noted on the prior CT, only vaguely visible on the chest x-ray. Lungs remain clear otherwise. Normal pulmonary vascularity. No pleural effusions. IMPRESSION: 1. No complicating features post bronchoscopy. 2. Patchy airspace opacities deep in the LEFT LOWER LOBE as noted on prior CT which are only vaguely visible on the chest x-ray. 3.  No acute cardiopulmonary disease otherwise. Electronically Signed   By: Evangeline Dakin M.D.   On: 06/10/2018 12:56   Ct Super D Chest W Contrast  Result Date: 05/26/2018 CLINICAL DATA:  Ground-glass opacity left lower lobe. Pre-procedure planning. EXAM: CT CHEST WITH CONTRAST TECHNIQUE: Multidetector CT imaging of the chest was performed using thin slice collimation for electromagnetic bronchoscopy planning  purposes, with intravenous contrast. CONTRAST:  55mL ISOVUE-300 IOPAMIDOL (ISOVUE-300) INJECTION 61% COMPARISON:  Abdomen pelvis CT 04/21/2018 FINDINGS: Cardiovascular: The heart size is normal. No substantial pericardial effusion. Mediastinum/Nodes: No mediastinal lymphadenopathy. There is no hilar lymphadenopathy. The esophagus has normal imaging features. There is no axillary lymphadenopathy. Lungs/Pleura: The central tracheobronchial airways are patent. Right lung clear. 2.9 x 3.3 x 1.6 cm heterogeneous multi lobular lesion of varying attenuation identified posterior left lower lobe, not substantially changed in the interval. Left lung otherwise clear. Upper Abdomen: Unremarkable. Musculoskeletal: No worrisome lytic or sclerotic osseous abnormality. IMPRESSION: 1. 3 cm multi lobular mixed attenuation lesion posterior left lower lobe, similar to prior study. Electronically Signed   By: Misty Stanley M.D.   On: 05/26/2018 16:04   Dg C-arm Bronchoscopy  Result Date: 06/10/2018 C-ARM BRONCHOSCOPY: Fluoroscopy was utilized by the requesting physician.  No radiographic interpretation.     Assessment & Plan:   Pneumothorax, left - S/p bronchoscopy on 10/2, developed sob and chest pain last night around 8pm. Small amounts of hemoptysis. VSS, O2sat 98% RA. Afebrile - Stat CXR today showed new 40-50% left-sided pneumothorax. No mediastinal shift - Dr. Lamonte Sakai in to speak with patient  - Plan admit to [facility]. Step down bed for pigtail chest tube placement       Martyn Ehrich, NP 06/11/2018

## 2018-06-11 NOTE — Telephone Encounter (Signed)
Spoke with pt, she is having some chest pain and pain with breathing. She would like an appt, I advised her that our NP has openings. She agreed and I scheduled her to see Beth today at 11:30. Nothing further is needed.

## 2018-06-11 NOTE — Assessment & Plan Note (Deleted)
-   S/p bronchoscopy on 10/2, developed sob and chest pain last night around 8pm. Small amounts of hemoptysis. VSS, O2sat 98% RA. Afebrile - Stat CXR today showed new 40-50% left-sided pneumothorax. No mediastinal shift - Dr. Lamonte Sakai in to speak with patient  - Plan admit to [facility] Step down bed for pigtail chest tube placement

## 2018-06-11 NOTE — Progress Notes (Signed)
PCCM:  Full consult note to follow.   Patient seen and examined. O2 sats stable. Does have dull chest pain.   Patient was consented for left sided pigtail catheter placement.   We attempted under sterile technique to insert a left sided anterior axillary approach to catheter placement. Once the wire was inserted the patient became very anxious with lots of movement in the bed. Stating she could breath and bending back and forth. Ultimately we had to abort the procedure due to a bent wire and the fact he kicked the bedside try knocking the dilator to the floor.   Therefore, we will regroup and use medications to make her more comfortable prior to the procedure to make it more tolerable.   Garner Nash, DO [facility] 06/11/2018 2:34 PM  Personal pager: [phone] If unanswered, please page CCM On-call: [phone]

## 2018-06-11 NOTE — H&P (Signed)
NAME:  Kathy Welch, MRN:  [id number], DOB:  Nov 05, 1985, LOS: 0 ADMISSION DATE:  06/11/2018, CONSULTATION DATE:  10/3 REFERRING MD:  Byrum, CHIEF COMPLAINT: Chest pain, shortness of breath, and a pneumothorax  Brief History   32 year old female who underwent navigational bronchoscopy for  Enlarging LLL ground glass  Opacity on 10/2. Admitted on 10/3 with large left pneumothorax  Past Medical History  Endometriosis, asthma, chronic migraines.  Bipolar disease, migratory rash Significant Hospital Events   10/3: Admitted with left pneumothorax  Consults: date of consult/date signed off & final recs:    Procedures (surgical and bedside):  Left lateral pigtail catheter placed with moderate conscious sedation 10/3  Significant Diagnostic Tests:  Chest x-ray obtained 10/3: Large left-sided pneumothorax approximately 40%  Micro Data:    Antimicrobials:     Subjective:  Patient was seen an examined. She states that she has felt chest pressure and dullness in the left side since yesterday. At this point she has been doing ok. She is anxious about every thing that has gone on.   Objective   Blood pressure 110/77, pulse 87, temperature 98.2 F (36.8 C), temperature source Oral, resp. rate 20, height 5\' 2"  (1.575 m), weight 70.2 kg, last menstrual period 05/24/2018, SpO2 100 %.       No intake or output data in the 24 hours ending 06/11/18 1645 Filed Weights   06/11/18 1500  Weight: 70.2 kg    Examination: General appearance: 32 y.o., female, NAD, conversant  Eyes: anicteric sclerae, moist conjunctivae; PERRLA, tracking appropriately HENT: NCAT; oropharynx, MMM, no mucosal ulcerations; normal hard and soft palate Neck: Trachea midline; lymphadenopathy, no JVD Lungs: diminished breath sounds on the left  CV: RRR, S1, S2, no MRGs  Abdomen: Soft, non-tender; non-distended, BS present  Extremities: No peripheral edema, radial and DP pulses present bilaterally  Skin: Normal  temperature, turgor and texture; no rash, several tattoos Psych: Appropriate affect Neuro: Alert and oriented to person and place, no focal deficit   Resolved Hospital Problem list     Assessment & Plan:  Large left pneumothorax following navigational bronchoscopy Plan Plans to place small bore chest tube 20 cm water suction Serial chest x-rays PRN analgesia  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 Status post navigational bronchoscopy 10/3 Plan Follow-up surgical pathology, cytology, AFB, and cultures  History of asthma Plan PRN bronchodilators   Disposition / Summary of Today's Plan 06/11/18   ICU level care     Diet: Advance as tolerated Pain/Anxiety/Delirium protocol (if indicated): Not indicated, did order analgesia for chest pain VAP protocol (if indicated): Not indicated DVT prophylaxis: scds GI prophylaxis: Not indicated Hyperglycemia protocol: None indicated Mobility: Increase activity as tolerated Code Status: Full code Family Communication: Pending  Labs   CBC: Recent Labs  Lab 06/10/18 0906  WBC 4.2  HGB 12.3  HCT 38.5  MCV 93.0  PLT 109    Basic Metabolic Panel: No results for input(s): NA, K, CL, CO2, GLUCOSE, BUN, CREATININE, CALCIUM, MG, PHOS in the last 168 hours. GFR: CrCl cannot be calculated (Patient's most recent lab result is older than the maximum 21 days allowed.). Recent Labs  Lab 06/10/18 0906  WBC 4.2    Liver Function Tests: No results for input(s): AST, ALT, ALKPHOS, BILITOT, PROT, ALBUMIN in the last 168 hours. No results for input(s): LIPASE, AMYLASE in the last 168 hours. No results for input(s): AMMONIA in the last 168 hours.  ABG No results found for: PHART, PCO2ART, PO2ART, HCO3,  TCO2, ACIDBASEDEF, O2SAT   Coagulation Profile: Recent Labs  Lab 06/10/18 0906  INR 1.14    Cardiac Enzymes: No results for input(s): CKTOTAL, CKMB, CKMBINDEX, TROPONINI in the last 168 hours.  HbA1C: No results found  for: HGBA1C  CBG: No results for input(s): GLUCAP in the last 168 hours.  Admitting History of Present Illness.   32 year old never smoker patient with history of childhood asthma, bipolar disease, and biopsy-proven endometriosis status post ablation in 2009 as well as 2016 treated with Lupron.  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  Seen by rheumatology for unifying diagnosis.  Referred to pulmonary clinic on 10/2 for evaluation of abnormal CT of chest showing left basilar infiltrate which was initially treated as possible pneumonia.  On film evaluation he had similar rounded left basilar opacity also appearing ground groundglass in nature.  [facility] imaging from August 2019 to September 2019 shows persistent mixed density left lower lobe groundglass opacity.  PFTs were obtained showing normal airflow, but some flattening of expiratory loop of air flow volume loop consistent with possible variable upper airway obstruction.  She was brought in on 10/2 for occasional bronchoscopy.  Bronchial needle brushings from the left lower lobe opacity, transbronchial forcep biopsies from the left lower lobe opacity and bronchial alveolar lavage from left lower lobe all sent she was discharged home.  On 10 3 she called the office reporting left-sided chest pain and shortness of breath, also pain associated with deep breath.  In office she was found to have a large left pneumothorax and therefore was sent to the hospital for admission and treatment.  Review of Systems:   Review of Systems  Constitutional: Negative for chills, fever, malaise/fatigue and weight loss.  HENT: Negative for hearing loss, sore throat and tinnitus.   Eyes: Negative for blurred vision and double vision.  Respiratory: Positive for hemoptysis. Negative for cough, sputum production, shortness of breath, wheezing and stridor.   Cardiovascular: Positive for chest pain. Negative for palpitations, orthopnea,  leg swelling and PND.  Gastrointestinal: Negative for abdominal pain, constipation, diarrhea, heartburn, nausea and vomiting.  Genitourinary: Negative for dysuria, hematuria and urgency.  Musculoskeletal: Negative for joint pain and myalgias.  Skin: Negative for itching and rash.  Neurological: Negative for dizziness, tingling, weakness and headaches.  Endo/Heme/Allergies: Negative for environmental allergies. Does not bruise/bleed easily.  Psychiatric/Behavioral: Negative for depression. The patient is nervous/anxious. The patient does not have insomnia.   All other systems reviewed and are negative.    Past Medical History  She,  has a past medical history of Anxiety, Asthma, Bipolar disorder ([place]), Complication of anesthesia, Depression, Dyspnea, Family history of adverse reaction to anesthesia, Headache, Left lower lobe pulmonary infiltrate (06/10/2018), Pneumonia, PONV (postoperative nausea and vomiting), Seizures ([place]), and UTI (lower urinary tract infection).   Surgical History    Past Surgical History:  Procedure Laterality Date  . back surgeries    . CHROMOPERTUBATION Bilateral 01/22/2016   Procedure: CHROMOPERTUBATION;  Surgeon: [name], MD;  Location: [facility] ORS;  Service: Gynecology;  Laterality: Bilateral;  . CYSTOSCOPY N/A 01/22/2016   Procedure: CYSTOSCOPY;  Surgeon: [name], MD;  Location: [facility] ORS;  Service: Gynecology;  Laterality: N/A;  . KNEE ARTHROSCOPY Right   . LAPAROSCOPIC APPENDECTOMY  01/22/2016   Procedure: APPENDECTOMY LAPAROSCOPIC;  Surgeon: [name], MD;  Location: [facility] ORS;  Service: Gynecology;;  . LAPAROSCOPIC LYSIS OF ADHESIONS Bilateral 01/22/2016   Procedure: LAPAROSCOPIC LYSIS OF ADHESIONS;  Surgeon: [name],  MD;  Location: [facility] ORS;  Service: Gynecology;  Laterality: Bilateral;  . LAPAROSCOPY N/A 01/22/2016   Procedure: LAPAROSCOPY DIAGNOSTIC/ pap smear;  Surgeon: [name], MD;  Location: [facility] ORS;  Service: Gynecology;   Laterality: N/A;  . LAPAROSCOPY N/A 01/22/2016   Procedure: LAPAROSCOPY OPERATIVE - excision of endometriosis;  Surgeon: [name], MD;  Location: [facility] ORS;  Service: Gynecology;  Laterality: N/A;  . LAPAROSCOPY  2010   Ablation  . Lumbar Disectomy  2015  . LUMBAR FUSION  2016   L5- S1  . SKIN GRAFT     lip  . VIDEO BRONCHOSCOPY WITH ENDOBRONCHIAL NAVIGATION N/A 06/10/2018   Procedure: VIDEO BRONCHOSCOPY WITH ENDOBRONCHIAL NAVIGATION;  Surgeon: Collene Gobble, MD;  Location: [facility] OR;  Service: Thoracic;  Laterality: N/A;     Social History   Social History   Socioeconomic History  . Marital status: Single    Spouse name: Not on file  . Number of children: Not on file  . Years of education: Not on file  . Highest education level: Not on file  Occupational History  . Not on file  Social Needs  . Financial resource strain: Not on file  . Food insecurity:    Worry: Not on file    Inability: Not on file  . Transportation needs:    Medical: Not on file    Non-medical: Not on file  Tobacco Use  . Smoking status: Never Smoker  . Smokeless tobacco: Never Used  Substance and Sexual Activity  . Alcohol use: Yes    Comment: occ  . Drug use: No  . Sexual activity: Not on file  Lifestyle  . Physical activity:    Days per week: Not on file    Minutes per session: Not on file  . Stress: Not on file  Relationships  . Social connections:    Talks on phone: Not on file    Gets together: Not on file    Attends religious service: Not on file    Active member of club or organization: Not on file    Attends meetings of clubs or organizations: Not on file    Relationship status: Not on file  . Intimate partner violence:    Fear of current or ex partner: Not on file    Emotionally abused: Not on file    Physically abused: Not on file    Forced sexual activity: Not on file  Other Topics Concern  . Not on file  Social History Narrative  . Not on file  ,  reports that she has  never smoked. She has never used smokeless tobacco. She reports that she drinks alcohol. She reports that she does not use drugs.   Family History   Her family history is not on file.   Allergies Allergies  Allergen Reactions  . Sulfa Antibiotics Hives  . Chlorhexidine Gluconate Itching    Topical on skin     Home Medications  Prior to Admission medications   Medication Sig Start Date End Date Taking? Authorizing Provider  ALPRAZolam Duanne Moron) 0.5 MG tablet Take 0.5 mg by mouth daily as needed for anxiety.     [provider]  buPROPion (WELLBUTRIN SR) 150 MG 12 hr tablet Take 150 mg by mouth 2 (two) times daily.    [provider]  cetirizine (ZYRTEC) 10 MG tablet Take 10 mg by mouth daily.     [provider]  etonogestrel (NEXPLANON) 68 MG IMPL implant 1 each by  Subdermal route once.    [provider]  gabapentin (NEURONTIN) 600 MG tablet Take 1 tablet (600 mg total) by mouth 2 (two) times daily. 06/10/18 06/10/19  Collene Gobble, MD  hydrOXYzine (ATARAX/VISTARIL) 25 MG tablet Take 25 mg by mouth at bedtime.     [provider]  PRESCRIPTION MEDICATION Apply 1 application topically daily. Triamcinolone mixed with Cerave    [provider]  rizatriptan (MAXALT) 10 MG tablet Take 10 mg by mouth as needed for migraine. May repeat in 2 hours if needed    [provider]   This patient is critically ill with multiple organ system failure; which, requires frequent high complexity decision making, assessment, support, evaluation, and titration of therapies. This was completed through the application of advanced monitoring technologies and extensive interpretation of multiple databases. During this encounter critical care time was devoted to patient care services described in this note for 38 minutes.   Garner Nash, DO [facility] 06/11/2018 4:45 PM  Personal pager: [phone] If unanswered, please  page CCM On-call: [phone]

## 2018-06-11 NOTE — Addendum Note (Signed)
Addended by: Jannette Spanner on: 06/11/2018 09:51 AM   Modules accepted: Orders

## 2018-06-11 NOTE — Procedures (Signed)
Chest Tube Insertion with Moderate Sedation Procedure Note  Indications:  Clinically significant Pneumothorax  Pre-operative Diagnosis: Pneumothorax  Post-operative Diagnosis: Pneumothorax  Procedure Details  Informed consent was obtained for the procedure, including sedation.  Risks of lung perforation, hemorrhage, arrhythmia, and adverse drug reaction were discussed. The patient received conscious sedation with 150 mcg fentanyl and 4 mg versed. Marni Griffon, NP was present during the conscious sedation portion of the procedure and monitoring her airway.   Conscious sedation time of 32 mins total.    After sterile skin prep, using standard technique, a 14 French tube was placed in the approximate left anterior 8 rib space along the anterior left axillary line lateral to the nipple   Following tube insertion it was sutured in place. There was appropriate tidal of fluid with respiration, contained within the line.    Findings: Aspiration with saline within the syringe revealed air.   Estimated Blood Loss:  Minimal, <1cc         Specimens:  None              Complications:  None; patient tolerated the procedure well.         Disposition: ICU - extubated and stable.         Condition: stable  Attending Attestation: I was present and scrubbed for the entire procedure.  A STAT CXR was ordered.   Garner Nash, DO [facility] 06/11/2018 3:37 PM  Personal pager: [phone] If unanswered, please page CCM On-call: [phone]

## 2018-06-11 NOTE — Assessment & Plan Note (Signed)
-   S/p bronchoscopy on 10/2, developed sob and chest pain last night around 8pm. Small amounts of hemoptysis. VSS, O2sat 98% RA. Afebrile - Stat CXR today showed new 40-50% left-sided pneumothorax. No mediastinal shift - Dr. Lamonte Sakai in to speak with patient  - Plan admit to [facility]. Step down bed for pigtail chest tube placement

## 2018-06-12 ENCOUNTER — Inpatient Hospital Stay (HOSPITAL_COMMUNITY): Payer: BLUE CROSS/BLUE SHIELD

## 2018-06-12 DIAGNOSIS — R0781 Pleurodynia: Secondary | ICD-10-CM

## 2018-06-12 DIAGNOSIS — Z9689 Presence of other specified functional implants: Secondary | ICD-10-CM

## 2018-06-12 LAB — BASIC METABOLIC PANEL
ANION GAP: 8 (ref 5–15)
BUN: 17 mg/dL (ref 6–20)
CHLORIDE: 110 mmol/L (ref 98–111)
CO2: 21 mmol/L — AB (ref 22–32)
Calcium: 8.2 mg/dL — ABNORMAL LOW (ref 8.9–10.3)
Creatinine, Ser: 0.83 mg/dL (ref 0.44–1.00)
GFR calc Af Amer: >60 mL/min (ref >60)
GLUCOSE: 89 mg/dL (ref 70–99)
POTASSIUM: 4.1 mmol/L (ref 3.5–5.1)
Sodium: 139 mmol/L (ref 135–145)

## 2018-06-12 LAB — CBC
HEMATOCRIT: 34 % — AB (ref 36.0–46.0)
HEMOGLOBIN: 11.1 g/dL — AB (ref 12.0–15.0)
MCH: 29.8 pg (ref 26.0–34.0)
MCHC: 32.6 g/dL (ref 30.0–36.0)
MCV: 91.2 fL (ref 78.0–100.0)
Platelets: 339 10*3/uL (ref 150–400)
RBC: 3.73 MIL/uL — AB (ref 3.87–5.11)
RDW: 13.1 % (ref 11.5–15.5)
WBC: 7.2 10*3/uL (ref 4.0–10.5)

## 2018-06-12 LAB — HIV ANTIBODY (ROUTINE TESTING W REFLEX): HIV Screen 4th Generation wRfx: NONREACTIVE

## 2018-06-12 LAB — CULTURE, RESPIRATORY: CULTURE: NO GROWTH

## 2018-06-12 MED ORDER — LIDOCAINE 5 % EX PTCH
1.0000 | MEDICATED_PATCH | CUTANEOUS | Status: DC
  Administered 2018-06-12 – 2018-06-13 (×2): 1 via TRANSDERMAL
  Filled 2018-06-12 (×2): qty 1

## 2018-06-12 NOTE — Progress Notes (Signed)
NAME:  MAISEY DEANDRADE, MRN:  [id number], DOB:  [birth year], LOS: 1 ADMISSION DATE:  06/11/2018, CONSULTATION DATE:  10/3 REFERRING MD:  Byrum, CHIEF COMPLAINT: Chest pain, shortness of breath, and a pneumothorax  Brief History   32 year old female who underwent navigational bronchoscopy for  Enlarging LLL ground glass  Opacity on 10/2. Admitted on 10/3 with large left pneumothorax  Past Medical History  Endometriosis, asthma, chronic migraines.  Bipolar disease, migratory rash Significant Hospital Events   10/3: Admitted with left pneumothorax; chest tube placed.  10/4: ptx resolved.   Consults: date of consult/date signed off & final recs:    Procedures (surgical and bedside):  Left lateral pigtail catheter placed with moderate conscious sedation 10/3  Significant Diagnostic Tests:  Chest x-ray obtained 10/3: Large left-sided pneumothorax approximately 40% 10/2 surgical pathology:Me new fragments of unremarkable lung parenchyma majority of fibrin. 10/2: Cytology:No malignant cells identified.  Vague granulomatous inflammation (see from transbronchial needle aspiration and left lower lobe brushings) Micro Data:  10/2: AFB negative (smear) 10/2 respiratory bronchial alveolar lavage no organisms>>> 10/2: Fungal culture>>>  Antimicrobials:     Subjective:  Complaining of left sided pleuritic chest discomfort primarily over posterior back Objective   Blood pressure 102/66, pulse (Abnormal) 56, temperature 98.3 F (36.8 C), temperature source Oral, resp. rate 18, height 5\' 2"  (1.575 m), weight 70.2 kg, last menstrual period 05/24/2018, SpO2 97 %.        Intake/Output Summary (Last 24 hours) at 06/12/2018 0816 Last data filed at 06/11/2018 1800 Gross per 24 hour  Intake 200 ml  Output no documentation  Net 200 ml   Filed Weights   06/11/18 1500  Weight: 70.2 kg    Examination: General: This is a healthy-appearing 32 year old female resting comfortably in bed currently  but does have discomfort with movement HEENT normocephalic atraumatic mucous membranes are moist Pulmonary: Clear bilateral no accessory use.  Does have pleuritic chest discomfort.  Left chest tube is unremarkable.  Good tidal noted and drainage system.  No air leak Cardiac: Regular rate and rhythm Abdomen: Soft nontender no organomegaly Extremities: Warm and dry brisk capillary refill Neuro: Awake oriented no focal deficits  Resolved Hospital Problem list     Assessment & Plan:  Large left pneumothorax following navigational bronchoscopy  Plan Plans to place small bore chest tube 20 cm water suction Serial chest x-rays PRN analgesia  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 Status post navigational bronchoscopy 10/3 -path negative for cancer cells  Plan F/u cultures   History of asthma Plan PRN BDs.    Disposition / Summary of Today's Plan 06/12/18   Continue stepdown level care.  Ambulate and mobilize.  Clamping chest tube.  If no pneumothorax chest tube likely to be removed on 10/5 and likely discharge later that day    Diet: Advance as tolerated Pain/Anxiety/Delirium protocol (if indicated): Not indicated, did order analgesia for chest pain VAP protocol (if indicated): Not indicated DVT prophylaxis: scds GI prophylaxis: Not indicated Hyperglycemia protocol: None indicated Mobility: Increase activity as tolerated Code Status: Full code Family Communication: Father and patient updated at bedside  Labs   CBC: Recent Labs  Lab 06/10/18 0906 06/12/18 0342  WBC 4.2 7.2  HGB 12.3 11.1*  HCT 38.5 34.0*  MCV 93.0 91.2  PLT 367 673    Basic Metabolic Panel: Recent Labs  Lab 06/12/18 0342  NA 139  K 4.1  CL 110  CO2 21*  GLUCOSE 89  BUN 17  CREATININE 0.83  CALCIUM 8.2*   GFR: Estimated Creatinine Clearance: 90.1 mL/min (by C-G formula based on SCr of 0.83 mg/dL). Recent Labs  Lab 06/10/18 0906 06/12/18 0342  WBC 4.2 7.2    Liver  Function Tests: No results for input(s): AST, ALT, ALKPHOS, BILITOT, PROT, ALBUMIN in the last 168 hours. No results for input(s): LIPASE, AMYLASE in the last 168 hours. No results for input(s): AMMONIA in the last 168 hours.  ABG No results found for: PHART, PCO2ART, PO2ART, HCO3, TCO2, ACIDBASEDEF, O2SAT   Coagulation Profile: Recent Labs  Lab 06/10/18 0906  INR 1.14    Cardiac Enzymes: No results for input(s): CKTOTAL, CKMB, CKMBINDEX, TROPONINI in the last 168 hours.  HbA1C: No results found for: HGBA1C  CBG: No results for input(s): GLUCAP in the last 168 hours.  Erick Colace ACNP-BC [place] Pager # [phone] OR # [phone] if no answer

## 2018-06-12 NOTE — Care Management Note (Signed)
Case Management Note  Patient Details  Name: Kathy Welch MRN: [id number] Date of Birth: [birth year]  Subjective/Objective:                  Large left pneumothorax-ct tube placment  Action/Plan: Following for progression of care and discharge planning No cm needs present at this time.  Expected Discharge Date:  (unknown)               Expected Discharge Plan:  Home/Self Care  In-House Referral:     Discharge planning Services  CM Consult  Post Acute Care Choice:    Choice offered to:     DME Arranged:    DME Agency:     HH Arranged:    HH Agency:     Status of Service:  In process, will continue to follow  If discussed at Long Length of Stay Meetings, dates discussed:    Additional Comments:  Leeroy Cha, RN 06/12/2018, 9:52 AM

## 2018-06-12 NOTE — Progress Notes (Signed)
   Post-Bronchoscopy/Post-Anesthesia Evaluation:  Subjective: This is a 32 y.o., female s/p pigtail catheter placement on 06/12/2018 for left sided pneumothorax. Complaining of soreness in the left chest.   Objective: BP 102/66   Pulse (!) 56   Temp 98.3 F (36.8 C) (Oral)   Resp 18   Ht 5\' 2"  (1.575 m)   Wt 70.2 kg   LMP 05/24/2018   SpO2 97%   BMI 28.31 kg/m    Physical Examination: Gen: NAD, resting comfortably  Cv: RRR, S1, S2 Resp: CTAB, no crackles, no wheeze  Abd: Soft, NT ND  Chest Imaging: post catheter placement with resolution of the pneumothorax.   Assessment: Stable post-moderate sedation anesthesia S/p pigtail catheter  Left chest pain post procedure  Plan: Stable fully recovered Advance diet as tolerated CT to -20 cmH2O  PRN IV toradol   Garner Nash, DO [facility] 06/12/2018 9:07 AM  Personal pager: [phone] If unanswered, please page CCM On-call: [phone]  *this is a delayed note entry

## 2018-06-13 ENCOUNTER — Inpatient Hospital Stay (HOSPITAL_COMMUNITY): Payer: BLUE CROSS/BLUE SHIELD

## 2018-06-13 DIAGNOSIS — J45909 Unspecified asthma, uncomplicated: Secondary | ICD-10-CM

## 2018-06-13 DIAGNOSIS — N809 Endometriosis, unspecified: Secondary | ICD-10-CM

## 2018-06-13 DIAGNOSIS — R9389 Abnormal findings on diagnostic imaging of other specified body structures: Secondary | ICD-10-CM

## 2018-06-13 MED ORDER — FENTANYL CITRATE (PF) 100 MCG/2ML IJ SOLN
25.0000 ug | Freq: Once | INTRAMUSCULAR | Status: AC
  Administered 2018-06-13: 25 ug via INTRAVENOUS

## 2018-06-13 MED ORDER — FENTANYL CITRATE (PF) 100 MCG/2ML IJ SOLN
INTRAMUSCULAR | Status: AC
  Filled 2018-06-13: qty 2

## 2018-06-13 NOTE — Discharge Instructions (Signed)

## 2018-06-13 NOTE — Discharge Summary (Signed)
Physician Discharge Summary         Patient ID: SHALESE STRAHAN MRN: [id number] DOB/AGE: 01/10/1986 32 y.o.  Admit date: 06/11/2018 Discharge date: 06/13/2018  Discharge Diagnoses:   Left pneumothorax, s/p bronchoscopy, ENB    Discharge summary    This is a 32 year old female who underwent electromagnetic navigational bronchoscopy on 06/10/2018 for an enlarging left lower lobe groundglass opacity.  Patient has a past medical history of endometriosis and there was some concern for possible pulmonary endometriosis therefore decision was made for bronchoscopy with biopsy.  Subsequently she returned to the pulmonary office on 06/11/2018 complaining of left-sided chest pain and a chest x-ray was completed which revealed a 40% pneumothorax.  Patient was admitted to [facility] and a 79 French pigtail was placed in the left pleural space with resolution of the pneumothorax.  This was done under conscious sedation.  Patient tolerated procedure well.  She stayed in the intensive care unit for an additional day after her chest drain was placed on waterseal.  On 06/13/2018 following morning chest x-ray revealed no expansion of the pneumothorax on waterseal.  The chest drain was removed and the patient was discharged home  Discharge Plan by Active Problems    Large left pneumothorax following navigational bronchoscopy Plan S/p 14 French pigtail catheter, removed on 06/13/2018  20 cm water suction, trial on water seal with no ptx expansion  As needed pain medications due to pain from the left chest tube insertion  IV Toradol, Norco, Lidoderm patch  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 Status post navigational bronchoscopy 10/3 -path negative for malignancy  Plan Follow-up with Dr. Baltazar Apo, MD in clinic   History of asthma Plan Continued as needed bronchodilators  Significant Hospital tests/ studies   06/11/2018: Pneumothorax chest x-ray  Procedures     06/11/2018: 14 French pigtail to left chest  Culture data/antimicrobials    06/10/2018: Bronchoscopy cultures, AFB, aerobic anaerobic, fungus negative   Consults  None   Discharge Exam: BP 123/75   Pulse 60   Temp 98.3 F (36.8 C) (Oral)   Resp (!) 22   Ht 5\' 2"  (1.575 m)   Wt 70.2 kg   LMP 05/24/2018   SpO2 100%   BMI 28.31 kg/m   General appearance: 32 y.o., female, NAD, conversant  Eyes: anicteric sclerae, moist conjunctivae; tracking  HENT: NCAT Neck: Trachea midline; no JVD  Lungs: CTAB, no crackles, no wheeze, with normal respiratory effort and no intercostal retractions CV: RRR, S1, S2, no MRGs  Abdomen: Soft, non-tender; non-distended, BS present  Extremities: No peripheral edema, radial and DP pulses present bilaterally  Skin: Normal temperature, turgor and texture; scattered tattoos  Psych: Appropriate affect Neuro: Alert and oriented to person and place, no focal deficit     Labs at discharge   Lab Results  Component Value Date   CREATININE 0.83 06/12/2018   BUN 17 06/12/2018   NA 139 06/12/2018   K 4.1 06/12/2018   CL 110 06/12/2018   CO2 21 (L) 06/12/2018   Lab Results  Component Value Date   WBC 7.2 06/12/2018   HGB 11.1 (L) 06/12/2018   HCT 34.0 (L) 06/12/2018   MCV 91.2 06/12/2018   PLT 339 06/12/2018   Lab Results  Component Value Date   ALT 51 (H) 04/30/2018   AST 36 04/30/2018   ALKPHOS 40 04/30/2018   BILITOT 0.5 04/30/2018   Lab Results  Component Value Date   INR 1.14 06/10/2018  Current radiological studies    Dg Chest 2 View  Result Date: 06/11/2018 CLINICAL DATA:  Sub sternal chest pain radiating toward the left. The patient underwent bronchoscopy yesterday. History of asthma EXAM: CHEST - 2 VIEW COMPARISON:  There is an approximately 35-40% left-sided pneumothorax. FINDINGS: There is a new approximately 40% left-sided pneumothorax. There is no mediastinal shift. There is no significant pleural effusion. The right lung  is clear. The heart and pulmonary vascularity are normal. The bony thorax is unremarkable. IMPRESSION: New 40-50% left-sided pneumothorax. No mediastinal shift. These results were called by telephone at the time of interpretation on 06/11/2018 at 11:27 am to [facility], NP, who verbally acknowledged these results. Electronically Signed   By: David  Martinique M.D.   On: 06/11/2018 11:34   Dg Chest Port 1 View  Result Date: 06/13/2018 CLINICAL DATA:  32 y/o  F; follow-up of left-sided pneumothorax. EXAM: PORTABLE CHEST 1 VIEW COMPARISON:  06/12/2018 chest radiograph FINDINGS: Stable normal cardiac silhouette given projection and technique. Stable left-sided chest tube. No appreciable pneumothorax. No new consolidation or effusion. Bones are unremarkable. IMPRESSION: Stable left-sided chest tube with no appreciable pneumothorax. Electronically Signed   By: Kristine Garbe M.D.   On: 06/13/2018 05:27   Portable Chest 1 View  Result Date: 06/12/2018 CLINICAL DATA:  Chest tube placement. EXAM: PORTABLE CHEST 1 VIEW COMPARISON:  Radiograph of June 11, 2018. FINDINGS: The heart size and mediastinal contours are within normal limits. Both lungs are clear. No pneumothorax or pleural effusion is noted. Left-sided chest tube is unchanged in position. The visualized skeletal structures are unremarkable. IMPRESSION: Left-sided chest tube is unchanged in position. No pneumothorax is noted. Electronically Signed   By: Marijo Conception, M.D.   On: 06/12/2018 07:04   Dg Chest Port 1 View  Result Date: 06/11/2018 CLINICAL DATA:  Chest tube placement for pneumothorax EXAM: PORTABLE CHEST 1 VIEW COMPARISON:  June 11, 2018 study obtained earlier in the day FINDINGS: There is now a chest tube on the left with resolution of pneumothorax. Lungs are clear. Heart size and pulmonary vascularity are normal. No adenopathy. No bone lesions. IMPRESSION: Resolution of left-sided pneumothorax following chest tube placement.  No edema or consolidation. Stable cardiac silhouette. Electronically Signed   By: Lowella Grip III M.D.   On: 06/11/2018 16:16    Disposition:     Discharge disposition: 01-Home or Self Care       Follow-up appointment   Follow-up is scheduled with Dr. Baltazar Apo, MD on 07/07/2018 3:45PM  Discharge Condition:    Stable  Physician Statement:   The Patient was personally examined, the discharge assessment and plan has been personally reviewed and I agree with ACNP Babcock's assessment and plan. 32 minutes of time have been dedicated to discharge assessment, planning and discharge instructions.   Signed: Octavio Graves Icard 06/13/2018, 8:28 AM

## 2018-06-13 NOTE — Progress Notes (Signed)
Discharge instructions given to patient, all questions answered at this time.  Pt. VSS with no s/s of distress noted.  Letter for work given to patient and all belonging with patient at discharge.

## 2018-06-18 ENCOUNTER — Telehealth: Payer: Self-pay | Admitting: Emergency Medicine

## 2018-06-18 NOTE — Telephone Encounter (Signed)
I spoke with the patient and reviewed the results with her. Granulomatous inflammation, cx negative so far. ? Possible sarcoidosis or HSP. Will follow cx data to completeness. Discuss further at her ROV

## 2018-06-18 NOTE — Telephone Encounter (Signed)
Called spoke with patient. Patient States she was told she would be called as soon as results came back . I informed patient there were not read results currently but would forward her message to Dr. Lamonte Sakai for further assistance.   Dr. Lamonte Sakai please advise on results of bronchoscopy

## 2018-06-30 ENCOUNTER — Other Ambulatory Visit: Payer: Self-pay | Admitting: Family

## 2018-06-30 ENCOUNTER — Ambulatory Visit
Admission: AD | Admit: 2018-06-30 | Discharge: 2018-06-30 | Disposition: A | Discharge status: Home / Self Care | Payer: BLUE CROSS/BLUE SHIELD | Source: Ambulatory Visit | Attending: Family | Admitting: Family

## 2018-06-30 ENCOUNTER — Ambulatory Visit
Admission: RE | Admit: 2018-06-30 | Discharge: 2018-06-30 | Disposition: A | Discharge status: Home / Self Care | Payer: BLUE CROSS/BLUE SHIELD | Source: Ambulatory Visit | Attending: Family | Admitting: Family

## 2018-06-30 DIAGNOSIS — R0602 Shortness of breath: Secondary | ICD-10-CM

## 2018-06-30 DIAGNOSIS — R079 Chest pain, unspecified: Secondary | ICD-10-CM

## 2018-07-07 ENCOUNTER — Encounter: Payer: Self-pay | Admitting: Emergency Medicine

## 2018-07-07 ENCOUNTER — Ambulatory Visit: Payer: BLUE CROSS/BLUE SHIELD | Admitting: Emergency Medicine

## 2018-07-07 VITALS — BP 110/60 | HR 72 | Ht 62.0 in | Wt 156.0 lb

## 2018-07-07 DIAGNOSIS — R9389 Abnormal findings on diagnostic imaging of other specified body structures: Secondary | ICD-10-CM

## 2018-07-07 DIAGNOSIS — J45909 Unspecified asthma, uncomplicated: Secondary | ICD-10-CM

## 2018-07-07 DIAGNOSIS — J939 Pneumothorax, unspecified: Secondary | ICD-10-CM | POA: Diagnosis not present

## 2018-07-07 DIAGNOSIS — D869 Sarcoidosis, unspecified: Secondary | ICD-10-CM | POA: Diagnosis not present

## 2018-07-07 NOTE — Assessment & Plan Note (Signed)
Transbronchial biopsies consistent with granulomatous inflammation, all cultures negative so far.  I suspect either hypersensitivity reaction or more likely sarcoidosis.  She has some scattered macular pale rash with a few papules, not entirely consistent with sarcoidosis but I think it would be best to try to get a unifying diagnosis before I treat her with steroids.  I will refer her to dermatology to see if we can get a biopsy in the near future, look for granulomas.

## 2018-07-07 NOTE — Assessment & Plan Note (Signed)
Resolved

## 2018-07-07 NOTE — Assessment & Plan Note (Signed)
Her spirometry is reassuring, I do not think she needs bronchodilators at this time.  Certainly if she has sarcoidosis she is at risk for obstructive lung disease

## 2018-07-07 NOTE — Addendum Note (Signed)
Addended by: Desmond Dike C on: 07/07/2018 04:32 PM   Modules accepted: Orders

## 2018-07-07 NOTE — Progress Notes (Signed)
Subjective:    Patient ID: Kathy Welch, female    DOB: 11/16/1985, 32 y.o.   MRN: [id number]  HPI 32 year old never smoker with a history of bipolar disorder, childhood asthma, probable adult asthma as well, restarted albuterol 2012.  She has a history of stage IV biopsy-proven endometriosis and has had endometrial ablations in 2009 and 2016, was treated with Lupron.  Also with a history of some rash that appears to be migratory, possibly psoriatic, associated with dizziness fatigue nausea.  This prompted rheumatological evaluation but no unifying diagnosis has been given.  She is referred today for left abdomen and pleuritic pain and an abnormal CT scan of the chest.    She was seen in the emergency department 04/21/2018 for left-sided abdominal pain cough, sneezing, pleuritic pain.  No fever or sputum.  A CT scan of her abdomen was done on 04/21/2018 that I reviewed.  The lung windows show a rounded left basilar infiltrate, principally groundglass in nature, suspicious for pneumonia. She was treated with azithro, but remained symptomatic. Prescribed pred but didn't take it. She is still having some SOB at rest, some chest tightness, fatigue, nausea, dizziness.   In retrospect she had a CT chest 07/26/2016 that showed a smaller rounded L basilar opacity, also GG, in almost the exact same location.  No clear etiology of this was determined.  She has a definite mold exposure.  Unclear whether this is related to her current presentation but certainly could cause reactive airways disease, etc.  No clear indication or symptoms to suggest invasive fungal disease.  ROV 06/04/18 --this is a follow-up visit for 32 year old woman with suspected asthma and an abnormal CT scan of the chest with a groundglass left basilar opacity first noted by CT 07/2016 and seen again on abdominal CT last month.  We repeated her chest CT on 05/26/2018 and I have reviewed, confirms the persistence of a mixed density left  lower lobe groundglass opacity measuring 2.9 x 3.3 x 1.6 cm.  I do not see any other nodular opacities.  She also underwent pulmonary function testing today which shows normal airflows without a bronchodilator response, some flattening of the inspiratory loop of her flow volume loop consistent with possible variable upper airway obstruction.  Her lung volumes were normal and her diffusion capacity is normal.   ROV 07/07/18 --follow-up visit for 32 year old woman with a slowly progressive left basilar groundglass opacity, first noted in 2017, larger on 05/26/2018.  Also some question of asthma, pulmonary function testing reassuring 06/04/2018.  She underwent bronchoscopy on 06/10/2018 that was unfortunately complicated by pneumothorax.  She had to be readmitted for tube thoracostomy with subsequent resolution.  Her biopsy results show evidence for granulomatous inflammation.  Culture data shows negative AFB smear, culture pending.  Fungal smear and culture are negative, bacterial culture negative. She still has some scattered patchy skin lesions. She has not seen rheum.  +   Review of Systems  Constitutional: Positive for fever. Negative for unexpected weight change.  HENT: Positive for congestion, nosebleeds, sinus pressure, sneezing, sore throat and trouble swallowing. Negative for dental problem, ear pain, postnasal drip and rhinorrhea.   Eyes: Negative for redness and itching.  Respiratory: Positive for cough, chest tightness, shortness of breath and wheezing.   Cardiovascular: Positive for palpitations. Negative for leg swelling.  Gastrointestinal: Positive for nausea and vomiting.  Genitourinary: Negative for dysuria.  Musculoskeletal: Negative for joint swelling.  Skin: Negative for rash.  Allergic/Immunologic: Negative.  Negative for environmental allergies,  food allergies and immunocompromised state.  Neurological: Positive for dizziness and headaches.  Hematological: Does not bruise/bleed  easily.  Psychiatric/Behavioral: Negative for dysphoric mood. The patient is nervous/anxious.    Past Medical History:  Diagnosis Date  . Anxiety   . Asthma    as a child  . Bipolar disorder ([place])    denies  . Complication of anesthesia    nausea  . Depression   . Dyspnea    with exertion , shortness of breath while sitting and speaking  . Family history of adverse reaction to anesthesia    Mother - nausea  . Headache    Migraines  . Left lower lobe pulmonary infiltrate 06/10/2018  . Pneumonia   . PONV (postoperative nausea and vomiting)    Nausea, does well with patch  . Seizures ([place])    "at age 71 after giving blood"  . UTI (lower urinary tract infection)      No family history on file.   Social History   Socioeconomic History  . Marital status: Single    Spouse name: Not on file  . Number of children: Not on file  . Years of education: Not on file  . Highest education level: Not on file  Occupational History  . Not on file  Social Needs  . Financial resource strain: Not on file  . Food insecurity:    Worry: Not on file    Inability: Not on file  . Transportation needs:    Medical: Not on file    Non-medical: Not on file  Tobacco Use  . Smoking status: Never Smoker  . Smokeless tobacco: Never Used  Substance and Sexual Activity  . Alcohol use: Yes    Comment: occ  . Drug use: No  . Sexual activity: Not on file  Lifestyle  . Physical activity:    Days per week: Not on file    Minutes per session: Not on file  . Stress: Not on file  Relationships  . Social connections:    Talks on phone: Not on file    Gets together: Not on file    Attends religious service: Not on file    Active member of club or organization: Not on file    Attends meetings of clubs or organizations: Not on file    Relationship status: Not on file  . Intimate partner violence:    Fear of current or ex partner: Not on file    Emotionally abused: Not on file    Physically  abused: Not on file    Forced sexual activity: Not on file  Other Topics Concern  . Not on file  Social History Narrative  . Not on file     Allergies  Allergen Reactions  . Sulfa Antibiotics Hives  . Chlorhexidine Gluconate Itching    Topical on skin     Outpatient Medications Prior to Visit  Medication Sig Dispense Refill  . ALPRAZolam (XANAX) 0.5 MG tablet Take 0.5 mg by mouth daily as needed for anxiety.     Marland Kitchen buPROPion (WELLBUTRIN SR) 150 MG 12 hr tablet Take 150 mg by mouth 2 (two) times daily.    . cetirizine (ZYRTEC) 10 MG tablet Take 10 mg by mouth daily.     Marland Kitchen etonogestrel (NEXPLANON) 68 MG IMPL implant 1 each by Subdermal route once.    . gabapentin (NEURONTIN) 600 MG tablet Take 1 tablet (600 mg total) by mouth 2 (two) times daily.    . hydrOXYzine (  ATARAX/VISTARIL) 25 MG tablet Take 25 mg by mouth at bedtime.     Marland Kitchen PRESCRIPTION MEDICATION Apply 1 application topically daily. Triamcinolone mixed with Cerave    . rizatriptan (MAXALT) 10 MG tablet Take 10 mg by mouth as needed for migraine. May repeat in 2 hours if needed     No facility-administered medications prior to visit.         Objective:   Physical Exam Vitals:   07/07/18 1547  BP: 110/60  Pulse: 72  SpO2: 97%  Weight: 156 lb (70.8 kg)  Height: 5\' 2"  (1.575 m)   Gen: Pleasant, well-nourished, in no distress,  normal affect  ENT: No lesions,  mouth clear,  oropharynx clear, no postnasal drip  Neck: No JVD, no stridor  Lungs: No use of accessory muscles, no wheeze or crackles.   Cardiovascular: RRR, heart sounds normal, no murmur or gallops, no peripheral edema  Musculoskeletal: No deformities, no cyanosis or clubbing  Neuro: alert, non focal  Skin: Warm, no lesions or rash     Assessment & Plan:  Abnormal CT of the chest Transbronchial biopsies consistent with granulomatous inflammation, all cultures negative so far.  I suspect either hypersensitivity reaction or more likely sarcoidosis.   She has some scattered macular pale rash with a few papules, not entirely consistent with sarcoidosis but I think it would be best to try to get a unifying diagnosis before I treat her with steroids.  I will refer her to dermatology to see if we can get a biopsy in the near future, look for granulomas.  Asthma Her spirometry is reassuring, I do not think she needs bronchodilators at this time.  Certainly if she has sarcoidosis she is at risk for obstructive lung disease  Pneumothorax on left Resolved  Baltazar Apo, MD, PhD 07/07/2018, 4:27 PM [facility] [phone] or if no answer [phone]

## 2018-07-07 NOTE — Patient Instructions (Signed)
Your lung biopsies show evidence for granulomatous inflammation, possibly hypersensitivity reaction or sarcoidosis. I would like you to see dermatology for a biopsy of your skin rash to see if we can correlate those findings with your lung biopsies. We will hold off on a rheumatology referral for now but this may be necessary as we go forward. Follow with Dr Lamonte Sakai in 2 months or sooner if you have any problems.

## 2018-07-10 LAB — FUNGUS CULTURE RESULT

## 2018-07-10 LAB — FUNGUS CULTURE WITH STAIN

## 2018-07-10 LAB — FUNGAL ORGANISM REFLEX

## 2018-07-24 LAB — ACID FAST CULTURE WITH REFLEXED SENSITIVITIES: ACID FAST CULTURE - AFSCU3: NEGATIVE

## 2018-08-20 ENCOUNTER — Encounter: Payer: Self-pay | Admitting: Emergency Medicine

## 2018-08-24 ENCOUNTER — Emergency Department
Admission: EM | Admit: 2018-08-24 | Discharge: 2018-08-24 | Disposition: A | Discharge status: Home / Self Care | Payer: BLUE CROSS/BLUE SHIELD | Attending: Emergency Medicine | Admitting: Emergency Medicine

## 2018-08-24 ENCOUNTER — Other Ambulatory Visit: Payer: Self-pay

## 2018-08-24 ENCOUNTER — Encounter: Payer: Self-pay | Admitting: Emergency Medicine

## 2018-08-24 DIAGNOSIS — Z79899 Other long term (current) drug therapy: Secondary | ICD-10-CM | POA: Insufficient documentation

## 2018-08-24 DIAGNOSIS — L853 Xerosis cutis: Secondary | ICD-10-CM | POA: Insufficient documentation

## 2018-08-24 DIAGNOSIS — R600 Localized edema: Secondary | ICD-10-CM | POA: Insufficient documentation

## 2018-08-24 DIAGNOSIS — L299 Pruritus, unspecified: Secondary | ICD-10-CM | POA: Insufficient documentation

## 2018-08-24 DIAGNOSIS — H10023 Other mucopurulent conjunctivitis, bilateral: Secondary | ICD-10-CM | POA: Insufficient documentation

## 2018-08-24 MED ORDER — PREDNISONE 10 MG PO TABS: 10.0000 mg | ORAL_TABLET | Freq: Every day | ORAL | 0 refills | Status: DC

## 2018-08-24 MED ORDER — OFLOXACIN 0.3 % OP SOLN: 1.0000 [drp] | Freq: Four times a day (QID) | OPHTHALMIC | 0 refills | Status: AC

## 2018-08-24 NOTE — ED Triage Notes (Signed)
Bilateral periorbital edema and irritation x 3 days. Itchy and painful. States has had patchy skin areas and is going to be worked up for sarcoidosis and has appointment for this December 27th however discomfort around eyes is too bothersome to wait for that appointment.

## 2018-08-24 NOTE — ED Notes (Signed)
Pt c/o irritating rash around th eyes for the past several months and has had a skin biopsy, states she was suppose to have screening for sarcoidosis but they just had to cancel her appt and states she needs relief. States she has tried every kind of cream , prescription or other wise with no relief.

## 2018-08-24 NOTE — ED Provider Notes (Signed)
[facility] Emergency Department Provider Note  Time seen: 4:31 PM  I have reviewed the triage vital signs and the nursing notes.   HISTORY  Chief Complaint periorbital edema and irritation    HPI Kathy Welch is a 32 y.o. female with a past medical history of anxiety, bipolar, depression, presents to the emergency department with itching around her eyes, periorbital swelling and dryness.  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  Patient states ever since that biopsy she has had dry skin and occasional swelling around both of her eyes.  States over the past 3 days it has become much worse has become very itchy at times she has discharge and matting from the eyes.  States when she awoke her eyes are almost swollen shut.  States she did not believe she could wait any longer to see her specialist so she came to the emergency department for evaluation.  Has tried Benadryl at home without relief, denies any new substances that could have caused the reaction.  Is currently using hydroxyzine at home but also states minimal to no relief.   Past Medical History:  Diagnosis Date  . Anxiety   . Asthma    as a child  . Bipolar disorder ([place])    denies  . Complication of anesthesia    nausea  . Depression   . Dyspnea    with exertion , shortness of breath while sitting and speaking  . Family history of adverse reaction to anesthesia    Mother - nausea  . Headache    Migraines  . Left lower lobe pulmonary infiltrate 06/10/2018  . Pneumonia   . PONV (postoperative nausea and vomiting)    Nausea, does well with patch  . Seizures ([place])    "at age 31 after giving blood"  . UTI  (lower urinary tract infection)     Patient Active Problem List   Diagnosis Date Noted  . Pneumothorax on left 06/11/2018  . Abnormal CT of the chest 05/19/2018  . Asthma 05/19/2018  . Endometriosis determined by laparoscopy 01/22/2016    Past Surgical History:  Procedure Laterality Date  . back surgeries    . CHROMOPERTUBATION Bilateral 01/22/2016   Procedure: CHROMOPERTUBATION;  Surgeon: [name], MD;  Location: [facility] ORS;  Service: Gynecology;  Laterality: Bilateral;  . CYSTOSCOPY N/A 01/22/2016   Procedure: CYSTOSCOPY;  Surgeon: [name], MD;  Location: [facility] ORS;  Service: Gynecology;  Laterality: N/A;  . KNEE ARTHROSCOPY Right   . LAPAROSCOPIC APPENDECTOMY  01/22/2016   Procedure: APPENDECTOMY LAPAROSCOPIC;  Surgeon: [name], MD;  Location: [facility];  Service: Gynecology;;  . LAPAROSCOPIC LYSIS OF ADHESIONS Bilateral 01/22/2016   Procedure: LAPAROSCOPIC LYSIS OF ADHESIONS;  Surgeon: [name], MD;  Location: [facility];  Service: Gynecology;  Laterality: Bilateral;  . LAPAROSCOPY N/A 01/22/2016   Procedure: LAPAROSCOPY DIAGNOSTIC/ pap smear;  Surgeon: [name], MD;  Location: [facility];  Service: Gynecology;  Laterality: N/A;  . LAPAROSCOPY N/A 01/22/2016   Procedure: LAPAROSCOPY OPERATIVE - excision of endometriosis;  Surgeon: [name], MD;  Location: [facility];  Service: Gynecology;  Laterality: N/A;  . LAPAROSCOPY  2010   Ablation  . Lumbar Disectomy  2015  .  LUMBAR FUSION  2016   L5- S1  . SKIN GRAFT     lip  . VIDEO BRONCHOSCOPY WITH ENDOBRONCHIAL NAVIGATION N/A 06/10/2018   Procedure: VIDEO BRONCHOSCOPY WITH ENDOBRONCHIAL NAVIGATION;  Surgeon: Collene Gobble, MD;  [facility];  Service: Thoracic;  Laterality: N/A;    Prior to Admission medications   Medication Sig Start Date End Date Taking? Authorizing Provider  ALPRAZolam Duanne Moron) 0.5 MG tablet Take 0.5 mg by mouth daily as needed for anxiety.     [provider]   buPROPion (WELLBUTRIN SR) 150 MG 12 hr tablet Take 150 mg by mouth 2 (two) times daily.    [provider]  cetirizine (ZYRTEC) 10 MG tablet Take 10 mg by mouth daily.     [provider]  etonogestrel (NEXPLANON) 68 MG IMPL implant 1 each by Subdermal route once.    [provider]  gabapentin (NEURONTIN) 600 MG tablet Take 1 tablet (600 mg total) by mouth 2 (two) times daily. 06/10/18 06/10/19  Collene Gobble, MD  hydrOXYzine (ATARAX/VISTARIL) 25 MG tablet Take 25 mg by mouth at bedtime.     [provider]  PRESCRIPTION MEDICATION Apply 1 application topically daily. Triamcinolone mixed with Cerave    [provider]  rizatriptan (MAXALT) 10 MG tablet Take 10 mg by mouth as needed for migraine. May repeat in 2 hours if needed    [provider]    Allergies  Allergen Reactions  . Sulfa Antibiotics Hives  . Chlorhexidine Gluconate Itching    Topical on skin    No family history on file.  Social History Social History   Tobacco Use  . Smoking status: Never Smoker  . Smokeless tobacco: Never Used  Substance Use Topics  . Alcohol use: Yes    Comment: occ  . Drug use: No    Review of Systems Constitutional: Negative for fever. Eyes: States itching within her eyes occasional discharge and matting. ENT: Swelling and itching around eyes Cardiovascular: Negative for chest pain. Respiratory: Mild shortness of breath, which she states is chronic since August of this year.  No worse. Gastrointestinal: Negative for abdominal pain Genitourinary: Negative for urinary compaints Musculoskeletal: Negative for musculoskeletal complaints Skin: Negative for skin complaints  Neurological: Negative for headache All other ROS negative  ____________________________________________   PHYSICAL EXAM:  VITAL SIGNS: ED Triage Vitals  Enc Vitals Group     BP 08/24/18 1406 130/90     Pulse Rate 08/24/18 1403 86     Resp 08/24/18 1403 18      Temp 08/24/18 1403 98.4 F (36.9 C)     Temp Source 08/24/18 1403 Oral     SpO2 08/24/18 1403 100 %     Weight 08/24/18 1404 150 lb (68 kg)     Height 08/24/18 1404 5\' 2"  (1.575 m)     Head Circumference --      Peak Flow --      Pain Score 08/24/18 1404 10     Pain Loc --      Pain Edu? --      Excl. in [place]? --    Constitutional: Alert and oriented. Well appearing and in no distress. Eyes: No conjunctival injection.  Patient does have erythema surrounding both of her eyes with dry skin with very minimal periorbital edema. ENT   Head: Normocephalic and atraumatic.   Mouth/Throat: Mucous membranes are moist. Cardiovascular: Normal rate, regular rhythm. No murmur Respiratory: Normal respiratory effort without tachypnea nor retractions.  Breath sounds are clear  Gastrointestinal: Soft and nontender. No distention.   Musculoskeletal: Nontender with normal range of motion in all extremities. Neurologic:  Normal speech and language. No gross focal neurologic deficits Skin:  Skin is warm.  Patient has erythema and dryness of the skin around both of her eyes. Psychiatric: Mood and affect are normal.   ____________________________________________   INITIAL IMPRESSION / ASSESSMENT AND PLAN / ED COURSE  Pertinent labs & imaging results that were available during my care of the patient were reviewed by me and considered in my medical decision making (see chart for details).  Patient presents to the emergency department for itching and swelling of both of her eyes.  States that has been an ongoing issue for the past several months but much worse over the past 3 days.  Differential this time would include autoimmune disorders such as sarcoidosis or lupus, would also include conjunctivitis possible superinfection given the amount of itching, she now describes discharge and matting at times.  Given the patient's complaints we will prescribe a course of prednisone.  We will also prescribe  ofloxacin topical drops.  Patient will continue to use hydroxyzine as needed for itching, she will follow-up with her primary care doctor as well as specialist as soon as she is able.  Patient agreeable to plan of care.  ____________________________________________   FINAL CLINICAL IMPRESSION(S) / ED DIAGNOSES  Conjunctivitis Periorbital edema/inflammation    Harvest Dark, MD 08/24/18 1635

## 2018-08-24 NOTE — Discharge Instructions (Addendum)
Please take your medications as prescribed.  Please follow-up with your doctor as well as specialist as soon as you are able.  Return to the emergency department for any worsening symptoms, development of fever, or any other symptom personally concerning to yourself.

## 2018-09-07 ENCOUNTER — Other Ambulatory Visit: Payer: Self-pay

## 2018-09-07 ENCOUNTER — Emergency Department: Payer: Self-pay

## 2018-09-07 ENCOUNTER — Emergency Department
Admission: EM | Admit: 2018-09-07 | Discharge: 2018-09-07 | Disposition: A | Discharge status: Home / Self Care | Payer: Self-pay | Attending: Emergency Medicine | Admitting: Emergency Medicine

## 2018-09-07 ENCOUNTER — Encounter: Payer: Self-pay | Admitting: Emergency Medicine

## 2018-09-07 DIAGNOSIS — Z79899 Other long term (current) drug therapy: Secondary | ICD-10-CM | POA: Insufficient documentation

## 2018-09-07 DIAGNOSIS — J45909 Unspecified asthma, uncomplicated: Secondary | ICD-10-CM | POA: Insufficient documentation

## 2018-09-07 DIAGNOSIS — D869 Sarcoidosis, unspecified: Secondary | ICD-10-CM | POA: Insufficient documentation

## 2018-09-07 DIAGNOSIS — R569 Unspecified convulsions: Secondary | ICD-10-CM | POA: Insufficient documentation

## 2018-09-07 MED ORDER — PREDNISONE 50 MG PO TABS: ORAL_TABLET | ORAL | 0 refills | Status: DC

## 2018-09-07 MED ORDER — DEXAMETHASONE SODIUM PHOSPHATE 10 MG/ML IJ SOLN
20.0000 mg | Freq: Once | INTRAMUSCULAR | Status: AC
  Administered 2018-09-07: 20 mg via INTRAMUSCULAR
  Filled 2018-09-07: qty 2

## 2018-09-07 NOTE — ED Triage Notes (Signed)
Presents with periorbital edema and irritation   States she was seen and placed prednisone    States when finished the meds her sxs' returned  Is scheduled to see PCP next week

## 2018-09-07 NOTE — ED Provider Notes (Signed)
[facility] Emergency Department Provider Note  ____________________________________________  Time seen: Approximately 4:10 PM  I have reviewed the triage vital signs and the nursing notes.   HISTORY  Chief Complaint No chief complaint on file.    HPI Kathy [facility] is a 32 y.o. female with a history of sarcoidosis, presents to the emergency department with mild shortness of breath, pruritic skin and periorbital edema.  Patient reports that she has experienced similar episodes of shortness of breath in the past with her sarcoidosis.  Patient reports that she had similar symptoms approximately 2 weeks ago that resolved while she was taking prednisone.  Patient was on tapered prednisone for 1 week.  Patient has a follow-up appointment with her pulmonologist in 5 days but reports that pruritus of periorbital skin is keeping her from resting at night.  Patient reports that she has tried multiple types of antihistamines and creams which have not relieved her symptoms.  Patient is here for steroid.   Past Medical History:  Diagnosis Date  . Anxiety   . Asthma    as a child  . Bipolar disorder ([place])    denies  . Complication of anesthesia    nausea  . Depression   . Dyspnea    with exertion , shortness of breath while sitting and speaking  . Family history of adverse reaction to anesthesia    Mother - nausea  . Headache    Migraines  . Left lower lobe pulmonary infiltrate 06/10/2018  . Pneumonia   . PONV (postoperative nausea and vomiting)    Nausea, does well with patch  . Seizures ([place])    "at age 64 after giving blood"  . UTI (lower urinary tract infection)     Patient Active Problem List   Diagnosis Date Noted  . Pneumothorax on left 06/11/2018  . Abnormal CT of the chest 05/19/2018  . Asthma 05/19/2018  . Endometriosis determined by laparoscopy 01/22/2016    Past Surgical History:  Procedure Laterality Date  . back surgeries    .  CHROMOPERTUBATION Bilateral 01/22/2016   Procedure: CHROMOPERTUBATION;  Surgeon: [name], MD;  Location: [facility];  Service: Gynecology;  Laterality: Bilateral;  . CYSTOSCOPY N/A 01/22/2016   Procedure: CYSTOSCOPY;  Surgeon: [name], MD;  Location: [facility];  Service: Gynecology;  Laterality: N/A;  . KNEE ARTHROSCOPY Right   . LAPAROSCOPIC APPENDECTOMY  01/22/2016   Procedure: APPENDECTOMY LAPAROSCOPIC;  Surgeon: [name], MD;  Location: [facility];  Service: Gynecology;;  . LAPAROSCOPIC LYSIS OF ADHESIONS Bilateral 01/22/2016   Procedure: LAPAROSCOPIC LYSIS OF ADHESIONS;  Surgeon: [name], MD;  Location: [facility];  Service: Gynecology;  Laterality: Bilateral;  . LAPAROSCOPY N/A 01/22/2016   Procedure: LAPAROSCOPY DIAGNOSTIC/ pap smear;  Surgeon: [name], MD;  Location: [facility];  Service: Gynecology;  Laterality: N/A;  . LAPAROSCOPY N/A 01/22/2016   Procedure: LAPAROSCOPY OPERATIVE - excision of endometriosis;  Surgeon: [name], MD;  Location: [facility];  Service: Gynecology;  Laterality: N/A;  . LAPAROSCOPY  2010   Ablation  . Lumbar Disectomy  2015  . LUMBAR FUSION  2016   L5- S1  . SKIN GRAFT     lip  . VIDEO BRONCHOSCOPY WITH ENDOBRONCHIAL NAVIGATION N/A 06/10/2018   Procedure: VIDEO BRONCHOSCOPY WITH ENDOBRONCHIAL NAVIGATION;  Surgeon: Collene Gobble, MD;  [facility]: [facility];  Service: Thoracic;  Laterality: N/A;    Prior to Admission medications   Medication Sig Start Date End Date Taking? Authorizing Provider  ALPRAZolam (XANAX) 0.5 MG tablet Take 0.5 mg by mouth daily as needed for anxiety.     [provider]  buPROPion (WELLBUTRIN SR) 150 MG 12 hr tablet Take 150 mg by mouth 2 (two) times daily.    [provider]  cetirizine (ZYRTEC) 10 MG tablet Take 10 mg by mouth daily.     [provider]  etonogestrel (NEXPLANON) 68 MG IMPL implant 1 each by Subdermal route once.    [provider]  gabapentin  (NEURONTIN) 600 MG tablet Take 1 tablet (600 mg total) by mouth 2 (two) times daily. 06/10/18 06/10/19  Collene Gobble, MD  hydrOXYzine (ATARAX/VISTARIL) 25 MG tablet Take 25 mg by mouth at bedtime.     [provider]  predniSONE (DELTASONE) 50 MG tablet Take one 50 mg tablet once daily for the next five days. 09/07/18   Lannie Fields, PA-C  PRESCRIPTION MEDICATION Apply 1 application topically daily. Triamcinolone mixed with Cerave    [provider]  rizatriptan (MAXALT) 10 MG tablet Take 10 mg by mouth as needed for migraine. May repeat in 2 hours if needed    [provider]    Allergies Sulfa antibiotics and Chlorhexidine gluconate  No family history on file.  Social History Social History   Tobacco Use  . Smoking status: Never Smoker  . Smokeless tobacco: Never Used  Substance Use Topics  . Alcohol use: Yes    Comment: occ  . Drug use: No     Review of Systems  Constitutional: No fever/chills Eyes: No visual changes. No discharge ENT: No upper respiratory complaints. Cardiovascular: no chest pain. Respiratory: no cough. No SOB. Gastrointestinal: No abdominal pain.  No nausea, no vomiting.  No diarrhea.  No constipation. Genitourinary: Negative for dysuria. No hematuria Musculoskeletal: Negative for musculoskeletal pain. Skin: Patient has periorbital pruritus. Neurological: Negative for headaches, focal weakness or numbness.   ____________________________________________   PHYSICAL EXAM:  VITAL SIGNS: ED Triage Vitals  Enc Vitals Group     BP 09/07/18 1502 130/84     Pulse Rate 09/07/18 1502 94     Resp --      Temp 09/07/18 1502 98.3 F (36.8 C)     Temp Source 09/07/18 1502 Oral     SpO2 09/07/18 1502 100 %     Weight 09/07/18 1459 150 lb 12.7 oz (68.4 kg)     Height 09/07/18 1459 5\' 2"  (1.575 m)     Head Circumference --      Peak Flow --      Pain Score 09/07/18 1510 3     Pain Loc --      Pain Edu? --      Excl. in [place]?  --      Constitutional: Alert and oriented. Well appearing and in no acute distress. Eyes: Conjunctivae are normal. PERRL. EOMI. Head: Atraumatic. ENT:      Ears: TMs are pearly.      Nose: No congestion/rhinnorhea.      Mouth/Throat: Mucous membranes are moist.  Neck: No stridor.  No cervical spine tenderness to palpation. Cardiovascular: Normal rate, regular rhythm. Normal S1 and S2.  Good peripheral circulation. Respiratory: Normal respiratory effort without tachypnea or retractions. Lungs CTAB. Good air entry to the bases with no decreased or absent breath sounds. Musculoskeletal: Full range of motion to all extremities. No gross deformities appreciated. Neurologic:  Normal speech and language. No gross focal neurologic deficits are appreciated.  Skin: Patient has mild periorbital  edema without erythema. Psychiatric: Mood and affect are normal. Speech and behavior are normal. Patient exhibits appropriate insight and judgement.   ____________________________________________   LABS (all labs ordered are listed, but only abnormal results are displayed)  Labs Reviewed - No data to display ____________________________________________  EKG   ____________________________________________  RADIOLOGY I personally viewed and evaluated these images as part of my medical decision making, as well as reviewing the written report by the radiologist.   Dg Chest 2 View  Result Date: 09/07/2018 CLINICAL DATA:  Shortness of breath. EXAM: CHEST - 2 VIEW COMPARISON:  Radiographs of June 30, 2018. FINDINGS: The heart size and mediastinal contours are within normal limits. Both lungs are clear. No pneumothorax or pleural effusion is noted. The visualized skeletal structures are unremarkable. IMPRESSION: No active cardiopulmonary disease. Electronically Signed   By: Marijo Conception, M.D.   On: 09/07/2018 16:23    ____________________________________________    PROCEDURES  Procedure(s)  performed:    Procedures    Medications  dexamethasone (DECADRON) injection 20 mg (20 mg Intramuscular Given 09/07/18 1638)     ____________________________________________   INITIAL IMPRESSION / ASSESSMENT AND PLAN / ED COURSE  Pertinent labs & imaging results that were available during my care of the patient were reviewed by me and considered in my medical decision making (see chart for details).  Review of the [place] CSRS was performed in accordance of the [place] prior to dispensing any controlled drugs.      Assessment and plan Sarcoidosis Patient presents to the emergency department with mild shortness of breath, pruritic skin and periorbital edema.  Chest x-ray reveals no new findings from previous x-ray.  Patient was given an injection of Decadron in the emergency department.  Advised patient that I would prefer her pulmonologist to manage use of steroid as patient was recently on tapered prednisone.  Patient is concerned that she will not be able to sleep due to pruritus without steroid.  I gave patient a prescription for prednisone but advised her to only start prednisone if her pruritus does not improve with Decadron administered in the emergency department.  Patient was advised to keep her pulmonology appointment in 1 week.  All patient questions were answered.  ____________________________________________  FINAL CLINICAL IMPRESSION(S) / ED DIAGNOSES  Final diagnoses:  Sarcoidosis      NEW MEDICATIONS STARTED DURING THIS VISIT:  ED Discharge Orders         Ordered    predniSONE (DELTASONE) 50 MG tablet     09/07/18 1645              This chart was dictated using voice recognition software/Dragon. Despite best efforts to proofread, errors can occur which can change the meaning. Any change was purely unintentional.    Lannie Fields, PA-C 09/07/18 1752    Schuyler Amor, MD 09/07/18 2052

## 2018-09-08 ENCOUNTER — Ambulatory Visit: Payer: BLUE CROSS/BLUE SHIELD | Admitting: Emergency Medicine

## 2018-09-15 ENCOUNTER — Encounter: Payer: Self-pay | Admitting: Emergency Medicine

## 2018-09-15 ENCOUNTER — Ambulatory Visit (INDEPENDENT_AMBULATORY_CARE_PROVIDER_SITE_OTHER): Payer: Self-pay | Admitting: Emergency Medicine

## 2018-09-15 VITALS — BP 110/78 | HR 91 | Ht 62.0 in | Wt 162.0 lb

## 2018-09-15 DIAGNOSIS — R9389 Abnormal findings on diagnostic imaging of other specified body structures: Secondary | ICD-10-CM

## 2018-09-15 DIAGNOSIS — D869 Sarcoidosis, unspecified: Secondary | ICD-10-CM

## 2018-09-15 DIAGNOSIS — R21 Rash and other nonspecific skin eruption: Secondary | ICD-10-CM

## 2018-09-15 MED ORDER — PREDNISONE 10 MG PO TABS: ORAL_TABLET | ORAL | 0 refills | Status: DC

## 2018-09-15 NOTE — Progress Notes (Signed)
Subjective:    Patient ID: Kathy Welch, female    DOB: 1986-08-01, 33 y.o.   MRN: [id number]  HPI 33 year old never smoker with a history of bipolar disorder, childhood asthma, probable adult asthma as well, restarted albuterol 2012.  She has a history of stage IV biopsy-proven endometriosis and has had endometrial ablations in 2009 and 2016, was treated with Lupron.  Also with a history of some rash that appears to be migratory, possibly psoriatic, associated with dizziness fatigue nausea.  This prompted rheumatological evaluation but no unifying diagnosis has been given.  She is referred today for left abdomen and pleuritic pain and an abnormal CT scan of the chest.    She was seen in the emergency department 04/21/2018 for left-sided abdominal pain cough, sneezing, pleuritic pain.  No fever or sputum.  A CT scan of her abdomen was done on 04/21/2018 that I reviewed.  The lung windows show a rounded left basilar infiltrate, principally groundglass in nature, suspicious for pneumonia. She was treated with azithro, but remained symptomatic. Prescribed pred but didn't take it. She is still having some SOB at rest, some chest tightness, fatigue, nausea, dizziness.   In retrospect she had a CT chest 07/26/2016 that showed a smaller rounded L basilar opacity, also GG, in almost the exact same location.  No clear etiology of this was determined.  She has a definite mold exposure.  Unclear whether this is related to her current presentation but certainly could cause reactive airways disease, etc.  No clear indication or symptoms to suggest invasive fungal disease.  ROV 06/04/18 --this is a follow-up visit for 33 year old woman with suspected asthma and an abnormal CT scan of the chest with a groundglass left basilar opacity first noted by CT 07/2016 and seen again on abdominal CT last month.  We repeated her chest CT on 05/26/2018 and I have reviewed, confirms the persistence of a mixed density left  lower lobe groundglass opacity measuring 2.9 x 3.3 x 1.6 cm.  I do not see any other nodular opacities.  She also underwent pulmonary function testing today which shows normal airflows without a bronchodilator response, some flattening of the inspiratory loop of her flow volume loop consistent with possible variable upper airway obstruction.  Her lung volumes were normal and her diffusion capacity is normal.   ROV 07/07/18 --follow-up visit for 33 year old woman with a slowly progressive left basilar groundglass opacity, first noted in 2017, larger on 05/26/2018.  Also some question of asthma, pulmonary function testing reassuring 06/04/2018.  She underwent bronchoscopy on 06/10/2018 that was unfortunately complicated by pneumothorax.  She had to be readmitted for tube thoracostomy with subsequent resolution.  Her biopsy results show evidence for granulomatous inflammation.  Culture data shows negative AFB smear, culture pending.  Fungal smear and culture are negative, bacterial culture negative. She still has some scattered patchy skin lesions. She has not seen rheum.   ROV 09/15/18 --33 year old woman whom I have seen for a left basilar groundglass opacity that had increased on serial CT scans of the chest, prompting bronchoscopy 06/10/2018.  Her biopsy results were consistent with poorly formed granulomatous inflammation, smears negative, culture negative.  She returns today reporting that she has been experiencing periorbital edema and rash  [facility] > punch biopsy >>   HSP panel, ANA, RF, ANCA   Review of Systems  Constitutional: Negative for fever and unexpected weight change.  HENT: Negative for congestion, dental problem, ear pain, nosebleeds, postnasal drip, rhinorrhea, sinus pressure, sneezing,  sore throat and trouble swallowing.   Eyes: Negative for redness and itching.  Respiratory: Negative for cough, chest tightness, shortness of breath and wheezing.   Cardiovascular: Negative for  palpitations and leg swelling.  Gastrointestinal: Negative for nausea and vomiting.  Genitourinary: Negative for dysuria.  Musculoskeletal: Negative for joint swelling.  Skin: Negative for rash.  Allergic/Immunologic: Negative.  Negative for environmental allergies, food allergies and immunocompromised state.  Neurological: Positive for dizziness and headaches.  Hematological: Does not bruise/bleed easily.  Psychiatric/Behavioral: Negative for dysphoric mood. The patient is nervous/anxious.    Past Medical History:  Diagnosis Date  . Anxiety   . Asthma    as a child  . Bipolar disorder ([place])    denies  . Complication of anesthesia    nausea  . Depression   . Dyspnea    with exertion , shortness of breath while sitting and speaking  . Family history of adverse reaction to anesthesia    Mother - nausea  . Headache    Migraines  . Left lower lobe pulmonary infiltrate 06/10/2018  . Pneumonia   . PONV (postoperative nausea and vomiting)    Nausea, does well with patch  . Seizures ([place])    "at age 23 after giving blood"  . UTI (lower urinary tract infection)      No family history on file.   Social History   Socioeconomic History  . Marital status: Single    Spouse name: Not on file  . Number of children: Not on file  . Years of education: Not on file  . Highest education level: Not on file  Occupational History  . Not on file  Social Needs  . Financial resource strain: Not on file  . Food insecurity:    Worry: Not on file    Inability: Not on file  . Transportation needs:    Medical: Not on file    Non-medical: Not on file  Tobacco Use  . Smoking status: Never Smoker  . Smokeless tobacco: Never Used  Substance and Sexual Activity  . Alcohol use: Yes    Comment: occ  . Drug use: No  . Sexual activity: Not on file  Lifestyle  . Physical activity:    Days per week: Not on file    Minutes per session: Not on file  . Stress: Not on file  Relationships  .  Social connections:    Talks on phone: Not on file    Gets together: Not on file    Attends religious service: Not on file    Active member of club or organization: Not on file    Attends meetings of clubs or organizations: Not on file    Relationship status: Not on file  . Intimate partner violence:    Fear of current or ex partner: Not on file    Emotionally abused: Not on file    Physically abused: Not on file    Forced sexual activity: Not on file  Other Topics Concern  . Not on file  Social History Narrative  . Not on file     Allergies  Allergen Reactions  . Sulfa Antibiotics Hives  . Chlorhexidine Gluconate Itching    Topical on skin     Outpatient Medications Prior to Visit  Medication Sig Dispense Refill  . ALPRAZolam (XANAX) 0.5 MG tablet Take 0.5 mg by mouth daily as needed for anxiety.     Marland Kitchen buPROPion (WELLBUTRIN SR) 150 MG 12 hr tablet Take 150  mg by mouth 2 (two) times daily.    . cetirizine (ZYRTEC) 10 MG tablet Take 10 mg by mouth daily.     Marland Kitchen etonogestrel (NEXPLANON) 68 MG IMPL implant 1 each by Subdermal route once.    . gabapentin (NEURONTIN) 600 MG tablet Take 1 tablet (600 mg total) by mouth 2 (two) times daily.    . hydrOXYzine (ATARAX/VISTARIL) 25 MG tablet Take 25 mg by mouth at bedtime.     Marland Kitchen PRESCRIPTION MEDICATION Apply 1 application topically daily. Triamcinolone mixed with Cerave    . rizatriptan (MAXALT) 10 MG tablet Take 10 mg by mouth as needed for migraine. May repeat in 2 hours if needed    . predniSONE (DELTASONE) 50 MG tablet Take one 50 mg tablet once daily for the next five days. 5 tablet 0   No facility-administered medications prior to visit.         Objective:   Physical Exam Vitals:   09/15/18 1509  BP: 110/78  Pulse: 91  SpO2: 100%  Weight: 162 lb (73.5 kg)  Height: 5\' 2"  (1.575 m)   Gen: Pleasant, well-nourished, in no distress,  normal affect  ENT: No lesions,  mouth clear,  oropharynx clear, no postnasal drip  Neck:  No JVD, no stridor  Lungs: No use of accessory muscles, no wheeze or crackles.   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  Also consider hypersensitivity pneumonitis.  She has had rash that does not sound particularly consistent with a standard sarcoid rash but is steroid responsive.  Hypersensitivity pneumonitis certainly could coexist with an allergic dermatitis.  I will check an HSP panel, screen with an ANA, ANCA, RF.  Refer her back to see her dermatologist at [facility] dermatology. Plan to follow her CT chest in March  We will check blood work today We will plan to repeat your CT scan of the chest without contrast in March 2020 We will review your case with [facility], work on have any seen there to discuss your biopsy findings, recurrent periocular rash. We will fill a prescription for prednisone for you to use for symptom management until Dermatology can see you and adjust your plan appropriately Follow with Dr Lamonte Sakai in 1 month  Rash of face Currently involving the periorbital region, eyelids but has also been present on her upper extremities.  She had a punch biopsy performed which she tells me was consistent with some form of allergic dermatitis.  I do not have the official path report available at this time.  I will plan to contact her dermatologist so that I can get the biopsy results, try to correlate this with her CT scan and lung biopsy findings.  Not clear to me that this is the same process.  I gave her a short course of steroids to last her until just a few days before her dermatology appointment as she has been very symptomatic and is fearful of recurrence when the prednisone  ends.  Baltazar Apo, MD, PhD 09/15/2018, 5:18 PM [facility] [phone] or if no answer [phone]

## 2018-09-15 NOTE — Assessment & Plan Note (Signed)
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  Also consider hypersensitivity pneumonitis.  She has had rash that does not sound particularly consistent with a standard sarcoid rash but is steroid responsive.  Hypersensitivity pneumonitis certainly could coexist with an allergic dermatitis.  I will check an HSP panel, screen with an ANA, ANCA, RF.  Refer her back to see her dermatologist at [facility]. Plan to follow her CT chest in March  We will check blood work today We will plan to repeat your CT scan of the chest without contrast in March 2020 We will review your case with [facility], work on have any seen there to discuss your biopsy findings, recurrent periocular rash. We will fill a prescription for prednisone for you to use for symptom management until Dermatology can see you and adjust your plan appropriately Follow with Dr Lamonte Sakai in 1 month

## 2018-09-15 NOTE — Assessment & Plan Note (Signed)
Currently involving the periorbital region, eyelids but has also been present on her upper extremities.  She had a punch biopsy performed which she tells me was consistent with some form of allergic dermatitis.  I do not have the official path report available at this time.  I will plan to contact her dermatologist so that I can get the biopsy results, try to correlate this with her CT scan and lung biopsy findings.  Not clear to me that this is the same process.  I gave her a short course of steroids to last her until just a few days before her dermatology appointment as she has been very symptomatic and is fearful of recurrence when the prednisone ends.

## 2018-09-15 NOTE — Patient Instructions (Signed)
We will check blood work today We will plan to repeat your CT scan of the chest without contrast in March 2020 We will review your case with [facility], work on have any seen there to discuss your biopsy findings, recurrent periocular rash. We will fill a prescription for prednisone for you to use for symptom management until Dermatology can see you and adjust your plan appropriately Follow with Dr Lamonte Sakai in 1 month

## 2018-09-18 ENCOUNTER — Telehealth: Payer: Self-pay | Admitting: Emergency Medicine

## 2018-09-18 MED ORDER — PREDNISONE 10 MG PO TABS: ORAL_TABLET | ORAL | 0 refills | Status: DC

## 2018-09-18 NOTE — Telephone Encounter (Signed)
Called and spoke with pt letting her know that BM stated we could increase pred dose. Pt expressed understanding. Rx sent in for pt.  Pt also stated to me that she called the dermatology office and was able to get an appt scheduled for this afternoon, 09/18/2018 at 2:50. Nothing further needed.

## 2018-09-18 NOTE — Telephone Encounter (Signed)
Mychart message from pt: Thank you so much for your prompt response! I am having some wheezing/crackling in the left lung and my skin patches are also flaring back up. I appreciate your help!    Called and spoke with pt. Pt stated meds begun yesterday, 09/17/2018 and also did meds today, 09/18/18 and states she is becoming worse.  Per pt, the patches on her right eye is the worse to the point that it is almost completely swollen shut. Pt is also currently on prednisone and is currently taking 20mg  but will soon go to 10mg . Pt stated that she was told at [facility] by [facility] 09/15/2018 if she thought that the current dose of prednisone was not working to see if the dose could be increased.  Pt also stated that she had a dermatology appt scheduled by our office for next Tuesday, 09/22/2018 but I stated to her to call them to let them know what is going on to see if there is any way she could be seen sooner than next Tuesday.  Aaron Edelman, please advise if we can do anything in regards to changing pt's prednisone dose and also advise on any other recs for pt. Thanks!

## 2018-09-18 NOTE — Telephone Encounter (Signed)
-----   Message -----  From: Chuck Hint Mountz  Sent: 1/10/20208:39 AM EST  To: Collene Gobble, MD Subject: Visit Follow-Up Question  Hey there, sorry to bother you guys! I started the prednisone the morning after my visit with you all, and my eyelids are continuing to swell, flake, itch and crack. Is there something else we can do? I am absolutely miserable and just need some relief. I have attached the image of my eye this morning. I have ointment on it, so the dryness is not as prevalent, however, the swelling is pretty noticeable. Thank you for taking the time to get back with me. I really appreciate you all and how awesome you have been through all of this !  Thanks, [employer], please advise. Waiting for the patient to respond if she is having any other symptoms. Thanks!

## 2018-09-18 NOTE — Telephone Encounter (Signed)
Can increase Prednisone dose:   Prednisone 10mg  tablet  >>>4 tabs for 2 days, then 3 tabs for 2 days, 2 tabs for 2 days, then 1 tab for 2 days, then stop >>>take with food  >>>take in the morning   Please place the order.   Please also make sure the patient is taking a daily antihistamine such as Benadryl at night.  Patient needs to contact dermatology today to ask for any other additional recommendations prior to her office visit that is scheduled.  Please explicitly remind the patient that if symptoms are worsening or shortness of breath is worsening she will need to present to an urgent care or emergency room for further evaluation.  Wyn Quaker, FNP

## 2018-09-18 NOTE — Telephone Encounter (Signed)
09/18/2018 0926  I am sorry to hear the patient is having the symptoms.  Is difficult to say based on the picture.  Please contact the patient.  What all is the patient using to help manage the symptoms.  I know that Dr. Lamonte Sakai gave her a short course of prednisone yesterday.  Previously had seen the prednisone as it helped with some of her symptoms.  Has she contacted [facility] her dermatologist regarding these acute symptoms that she is having?  The lab work that Dr. Lamonte Sakai performed is still pending.  Is the patient using any eyedrops?  Does she have any eye redness?  She have any vision changes? any eye pain?  Wyn Quaker, FNP

## 2018-09-18 NOTE — Telephone Encounter (Signed)
Left VM for patient to call back as well as sent a MyChart message. Will await her response.

## 2018-09-20 LAB — HYPERSENSITIVITY PNUEMONITIS PROFILE
ASPERGILLUS FUMIGATUS: NEGATIVE
Faenia retivirgula: NEGATIVE
Pigeon Serum: NEGATIVE
S. VIRIDIS: NEGATIVE
T. CANDIDUS: NEGATIVE
T. VULGARIS: NEGATIVE

## 2018-09-20 LAB — ANTI-DNA ANTIBODY, DOUBLE-STRANDED: ds DNA Ab: <1 [IU]/mL

## 2018-09-20 LAB — ALDOLASE: ALDOLASE: 5.4 U/L (ref <=8.1)

## 2018-09-20 LAB — ANCA SCREEN W REFLEX TITER: ANCA Screen: NEGATIVE

## 2018-09-20 LAB — CYCLIC CITRUL PEPTIDE ANTIBODY, IGG

## 2018-09-20 LAB — ANGIOTENSIN CONVERTING ENZYME: Angiotensin-Converting Enzyme: 14 U/L (ref 9–67)

## 2018-09-20 LAB — RHEUMATOID FACTOR: Rheumatoid fact SerPl-aCnc: <14 [IU]/mL

## 2018-10-16 ENCOUNTER — Ambulatory Visit: Payer: Self-pay | Admitting: Emergency Medicine

## 2018-11-16 ENCOUNTER — Ambulatory Visit
Admission: RE | Admit: 2018-11-16 | Discharge: 2018-11-16 | Disposition: A | Discharge status: Home / Self Care | Payer: Self-pay | Source: Ambulatory Visit | Attending: Emergency Medicine | Admitting: Emergency Medicine

## 2018-11-16 DIAGNOSIS — D869 Sarcoidosis, unspecified: Secondary | ICD-10-CM | POA: Insufficient documentation

## 2018-11-24 ENCOUNTER — Encounter: Payer: Self-pay | Admitting: Emergency Medicine

## 2018-11-24 ENCOUNTER — Other Ambulatory Visit: Payer: Self-pay

## 2018-11-24 ENCOUNTER — Ambulatory Visit (INDEPENDENT_AMBULATORY_CARE_PROVIDER_SITE_OTHER): Payer: Self-pay | Admitting: Emergency Medicine

## 2018-11-24 ENCOUNTER — Ambulatory Visit: Payer: Self-pay | Admitting: Nurse Practitioner

## 2018-11-24 DIAGNOSIS — D869 Sarcoidosis, unspecified: Secondary | ICD-10-CM

## 2018-11-24 MED ORDER — ALBUTEROL SULFATE HFA 108 (90 BASE) MCG/ACT IN AERS: 2.0000 | INHALATION_SPRAY | RESPIRATORY_TRACT | 5 refills | Status: AC | PRN

## 2018-11-24 NOTE — Assessment & Plan Note (Signed)
Reassuring CT scan of the chest, left basilar infiltrate is actually less prominent.  I do not think we need to treat with steroids at this point.  Depending on how her symptoms evolve, radiographic stability we will decide whether to treat with corticosteroids.  She needs an albuterol inhaler and we will arrange for this  We will refill your albuterol prescription.  Please keep it available to use 2 puffs if needed for shortness breath, chest tightness, wheezing. We will plan to follow-up in 6 months with a chest x-ray at that time.  If you develop any new symptoms of shortness of breath, wheeze, characteristic rash then please call our office and we will see you sooner. We will plan to repeat your CT scan of the chest in 1 year or sooner depending on how your symptoms evolve.

## 2018-11-24 NOTE — Progress Notes (Signed)
Subjective:    Patient ID: Kathy Welch, female    DOB: May 24, 1986, 33 y.o.   MRN: [id number]  HPI 33 year old never smoker with a history of bipolar disorder, childhood asthma, probable adult asthma as well, restarted albuterol 2012.  She has a history of stage IV biopsy-proven endometriosis and has had endometrial ablations in 2009 and 2016, was treated with Lupron.  Also with a history of some rash that appears to be migratory, possibly psoriatic, associated with dizziness fatigue nausea.  This prompted rheumatological evaluation but no unifying diagnosis has been given.  She is referred today for left abdomen and pleuritic pain and an abnormal CT scan of the chest.    She was seen in the emergency department 04/21/2018 for left-sided abdominal pain cough, sneezing, pleuritic pain.  No fever or sputum.  A CT scan of her abdomen was done on 04/21/2018 that I reviewed.  The lung windows show a rounded left basilar infiltrate, principally groundglass in nature, suspicious for pneumonia. She was treated with azithro, but remained symptomatic. Prescribed pred but didn't take it. She is still having some SOB at rest, some chest tightness, fatigue, nausea, dizziness.   In retrospect she had a CT chest 07/26/2016 that showed a smaller rounded L basilar opacity, also GG, in almost the exact same location.  No clear etiology of this was determined.  She has a definite mold exposure.  Unclear whether this is related to her current presentation but certainly could cause reactive airways disease, etc.  No clear indication or symptoms to suggest invasive fungal disease.  ROV 06/04/18 --this is a follow-up visit for 33 year old woman with suspected asthma and an abnormal CT scan of the chest with a groundglass left basilar opacity first noted by CT 07/2016 and seen again on abdominal CT last month.  We repeated her chest CT on 05/26/2018 and I have reviewed, confirms the persistence of a mixed density left  lower lobe groundglass opacity measuring 2.9 x 3.3 x 1.6 cm.  I do not see any other nodular opacities.  She also underwent pulmonary function testing today which shows normal airflows without a bronchodilator response, some flattening of the inspiratory loop of her flow volume loop consistent with possible variable upper airway obstruction.  Her lung volumes were normal and her diffusion capacity is normal.   ROV 07/07/18 --follow-up visit for 33 year old woman with a slowly progressive left basilar groundglass opacity, first noted in 2017, larger on 05/26/2018.  Also some question of asthma, pulmonary function testing reassuring 06/04/2018.  She underwent bronchoscopy on 06/10/2018 that was unfortunately complicated by pneumothorax.  She had to be readmitted for tube thoracostomy with subsequent resolution.  Her biopsy results show evidence for granulomatous inflammation.  Culture data shows negative AFB smear, culture pending.  Fungal smear and culture are negative, bacterial culture negative. She still has some scattered patchy skin lesions. She has not seen rheum.   ROV 09/15/18 --33 year old woman whom I have seen for a left basilar groundglass opacity that had increased on serial CT scans of the chest, prompting bronchoscopy 06/10/2018.  Her biopsy results were consistent with poorly formed granulomatous inflammation, smears negative, culture negative.  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  The diagnosis granulomatous disease was made on navigational bronchoscopy, all cultures negative.  Since last time she had a reassuring HSP panel, ANA, RF, ANCA.  A CT scan of the chest was done on  11/16/2018 which I reviewed.  This shows some very subtle evidence for some patchy peribronchovascular nodularity in the right upper lobe, left lower lobe in the area where she previously had a more focal groundglass infiltrate.  There has been large-scale resolution in this area. Last time she was on prednisone was December.     Review of Systems  Constitutional: Negative for fever and unexpected weight change.  HENT: Negative for congestion, dental problem, ear pain, nosebleeds, postnasal drip, rhinorrhea, sinus pressure, sneezing, sore throat and trouble swallowing.   Eyes: Negative for redness and itching.  Respiratory: Negative for cough, chest tightness, shortness of breath and wheezing.   Cardiovascular: Negative for palpitations and leg swelling.  Gastrointestinal: Negative for nausea and vomiting.  Genitourinary: Negative for dysuria.  Musculoskeletal: Negative for joint swelling.  Skin: Negative for rash.  Allergic/Immunologic: Negative.  Negative for environmental allergies, food allergies and immunocompromised state.  Neurological: Positive for dizziness and headaches.  Hematological: Does not bruise/bleed easily.  Psychiatric/Behavioral: Negative for dysphoric mood. The patient is nervous/anxious.    Past Medical History:  Diagnosis Date  . Anxiety   . Asthma    as a child  . Bipolar disorder ([place])    denies  . Complication of anesthesia    nausea  . Depression   . Dyspnea    with exertion , shortness of breath while sitting and speaking  . Family history of adverse reaction to anesthesia    Mother - nausea  . Headache    Migraines  . Left lower lobe pulmonary infiltrate 06/10/2018  . Pneumonia   . PONV (postoperative nausea and vomiting)    Nausea, does well with patch  . Seizures ([place])    "at age 57 after giving blood"  . UTI (lower urinary tract infection)      No family history on file.   Social History   Socioeconomic History  . Marital status: Single     Spouse name: Not on file  . Number of children: Not on file  . Years of education: Not on file  . Highest education level: Not on file  Occupational History  . Not on file  Social Needs  . Financial resource strain: Not on file  . Food insecurity:    Worry: Not on file    Inability: Not on file  . Transportation needs:    Medical: Not on file    Non-medical: Not on file  Tobacco Use  . Smoking status: Never Smoker  . Smokeless tobacco: Never Used  Substance and Sexual Activity  . Alcohol use: Yes    Comment: occ  . Drug use: No  . Sexual activity: Not on file  Lifestyle  . Physical activity:    Days per week: Not on file    Minutes per session: Not on file  . Stress: Not on file  Relationships  . Social connections:    Talks on phone: Not on file    Gets together: Not on file    Attends religious service: Not on file    Active member of club or organization: Not on file    Attends meetings of clubs or organizations: Not on file    Relationship status: Not on file  . Intimate partner violence:    Fear of  current or ex partner: Not on file    Emotionally abused: Not on file    Physically abused: Not on file    Forced sexual activity: Not on file  Other Topics Concern  . Not on file  Social History Narrative  . Not on file     Allergies  Allergen Reactions  . Meat [Alpha-Gal] Diarrhea, Nausea And Vomiting and Rash    RED meat - flares Sarcoidosis  . Sulfa Antibiotics Hives  . Chlorhexidine Gluconate Itching    Topical on skin     Outpatient Medications Prior to Visit  Medication Sig Dispense Refill  . ALPRAZolam (XANAX) 0.5 MG tablet Take 0.5 mg by mouth daily as needed for anxiety.     Marland Kitchen buPROPion (WELLBUTRIN SR) 150 MG 12 hr tablet Take 150 mg by mouth 2 (two) times daily.    Marland Kitchen etonogestrel (NEXPLANON) 68 MG IMPL implant 1 each by Subdermal route once.    . hydrocortisone 2.5 % cream Apply 1 application topically daily as needed.    . hydrOXYzine  (ATARAX/VISTARIL) 25 MG tablet Take 25 mg by mouth at bedtime.     Marland Kitchen loratadine (CLARITIN) 10 MG tablet Take 10 mg by mouth daily.    . rizatriptan (MAXALT) 10 MG tablet Take 10 mg by mouth as needed for migraine. May repeat in 2 hours if needed    . cetirizine (ZYRTEC) 10 MG tablet Take 10 mg by mouth daily.     Marland Kitchen gabapentin (NEURONTIN) 600 MG tablet Take 1 tablet (600 mg total) by mouth 2 (two) times daily.    . predniSONE (DELTASONE) 10 MG tablet Take 4tabsx2days,3tabsx2days,1tabx2days,1tabx2days,thenstop. 20 tablet 0  . PRESCRIPTION MEDICATION Apply 1 application topically daily. Triamcinolone mixed with Cerave     No facility-administered medications prior to visit.         Objective:   Physical Exam Vitals:   11/24/18 1455  BP: 122/80  Pulse: 100  SpO2: 99%  Weight: 165 lb (74.8 kg)   Gen: Pleasant, well-nourished, in no distress,  normal affect  ENT: No lesions,  mouth clear,  oropharynx clear, no postnasal drip  Neck: No JVD, no stridor  Lungs: No use of accessory muscles, no wheeze or crackles.   Cardiovascular: RRR, heart sounds normal, no murmur or gallops, no peripheral edema  Musculoskeletal: No deformities, no cyanosis or clubbing  Neuro: alert, non focal  Skin: Warm, no lesions or rash     Assessment & Plan:  Sarcoidosis Reassuring CT scan of the chest, left basilar infiltrate is actually less prominent.  I do not think we need to treat with steroids at this point.  Depending on how her symptoms evolve, radiographic stability we will decide whether to treat with corticosteroids.  She needs an albuterol inhaler and we will arrange for this  We will refill your albuterol prescription.  Please keep it available to use 2 puffs if needed for shortness breath, chest tightness, wheezing. We will plan to follow-up in 6 months with a chest x-ray at that time.  If you develop any new symptoms of shortness of breath, wheeze, characteristic rash then please call our  office and we will see you sooner. We will plan to repeat your CT scan of the chest in 1 year or sooner depending on how your symptoms evolve.  Baltazar Apo, MD, PhD 11/24/2018, 3:46 PM [facility] [phone] or if no answer [phone]

## 2018-11-24 NOTE — Patient Instructions (Signed)
We will refill your albuterol prescription.  Please keep it available to use 2 puffs if needed for shortness breath, chest tightness, wheezing. We will plan to follow-up in 6 months with a chest x-ray at that time.  If you develop any new symptoms of shortness of breath, wheeze, characteristic rash then please call our office and we will see you sooner. We will plan to repeat your CT scan of the chest in 1 year or sooner depending on how your symptoms evolve.

## 2019-03-22 DIAGNOSIS — D869 Sarcoidosis, unspecified: Secondary | ICD-10-CM

## 2019-03-22 NOTE — Telephone Encounter (Signed)
Dr. Lamonte Sakai,  Is it ok to send in a referral to rheumatology?

## 2019-03-23 NOTE — Telephone Encounter (Signed)
Please make the Rheumatology referral for sarcoidosis  Also, please ask her if she would like to be seen here, we may have to coordinate her treatment (will probably be prednisone at least to start) with Rheumatology since she is having respiratory symptoms.

## 2019-05-11 NOTE — Telephone Encounter (Signed)
PCC's was this referral sent to [facility]?

## 2019-05-11 NOTE — Telephone Encounter (Signed)
No this order was sent to [facility] clinic in [place] it was placed in proficient the referral system that is used they flagged it and stated they couldn't print records so they asked for it to be faxed I have faxed the records.

## 2019-07-01 ENCOUNTER — Other Ambulatory Visit (HOSPITAL_COMMUNITY): Payer: Self-pay | Admitting: Obstetrics and Gynecology

## 2019-07-01 ENCOUNTER — Other Ambulatory Visit: Payer: Self-pay | Admitting: Obstetrics and Gynecology

## 2019-07-01 DIAGNOSIS — R1032 Left lower quadrant pain: Secondary | ICD-10-CM

## 2019-07-01 DIAGNOSIS — R102 Pelvic and perineal pain: Secondary | ICD-10-CM

## 2019-07-02 ENCOUNTER — Ambulatory Visit
Admission: RE | Admit: 2019-07-02 | Discharge: 2019-07-02 | Disposition: A | Discharge status: Home / Self Care | Payer: Medicaid Other | Source: Ambulatory Visit | Attending: Obstetrics and Gynecology | Admitting: Obstetrics and Gynecology

## 2019-07-02 ENCOUNTER — Other Ambulatory Visit: Payer: Self-pay

## 2019-07-02 DIAGNOSIS — R1032 Left lower quadrant pain: Secondary | ICD-10-CM | POA: Insufficient documentation

## 2019-07-02 DIAGNOSIS — R102 Pelvic and perineal pain: Secondary | ICD-10-CM | POA: Insufficient documentation

## 2019-07-02 MED ORDER — GADOBUTROL 1 MMOL/ML IV SOLN
7.0000 mL | Freq: Once | INTRAVENOUS | Status: AC | PRN
  Administered 2019-07-02: 7 mL via INTRAVENOUS

## 2019-08-13 IMAGING — CT CT CHEST SUPER D W/ CM
2 of 4 series · 15 of 36 positions shown, 18 images · IV contrast (iopamidol)
Comparison: Abdomen pelvis CT 04/21/2018

CLINICAL DATA: Ground-glass opacity left lower lobe. Pre-procedure
planning.

EXAM:
CT CHEST WITH CONTRAST
TECHNIQUE: Multidetector CT imaging of the chest was performed using thin slice
collimation for electromagnetic bronchoscopy planning purposes, with
intravenous contrast.
CONTRAST:  80mL FQMRDA-Q88 IOPAMIDOL (FQMRDA-Q88) INJECTION 61%

[Series 4: thins · axial · 0.63mm/px · z∈[-312,-52]mm · 12 of 364 slices shown, 15 images]
[im 20/364  mediastinal]
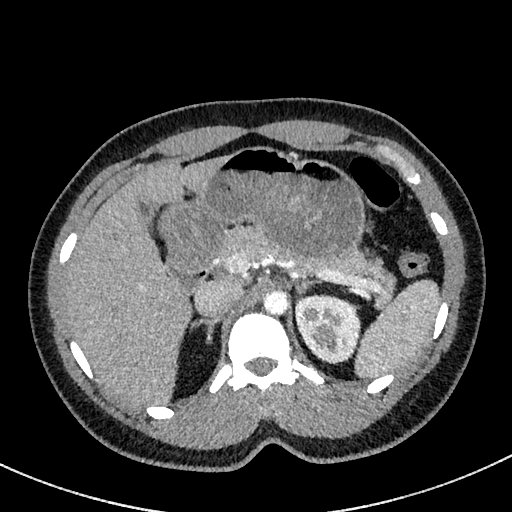
[im 20/364  lung]
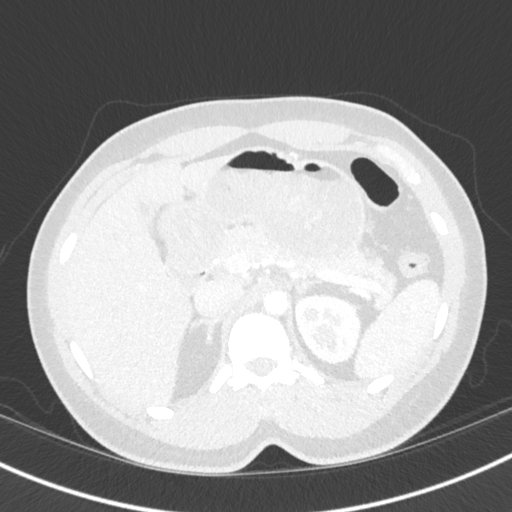
[im 58/364  lung]
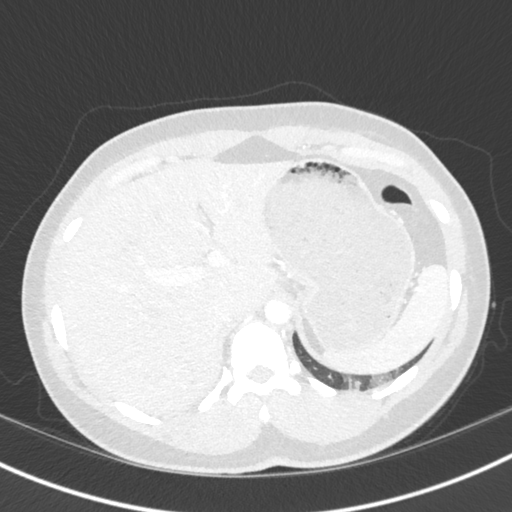
[im 77/364  lung]
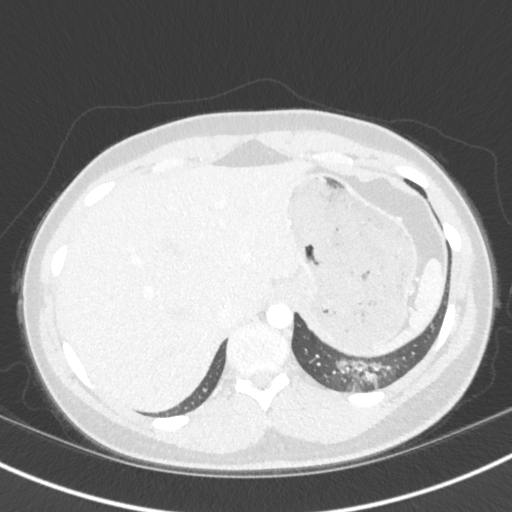
[im 115/364  lung]
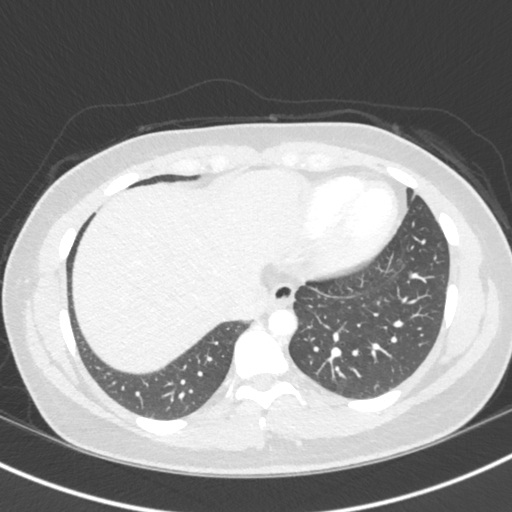
[im 134/364  mediastinal]
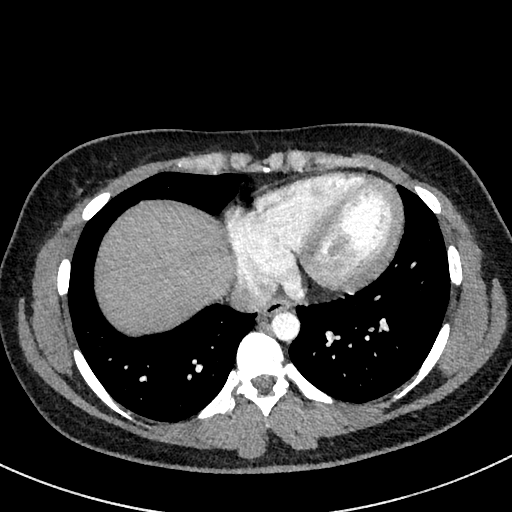
[im 134/364  lung]
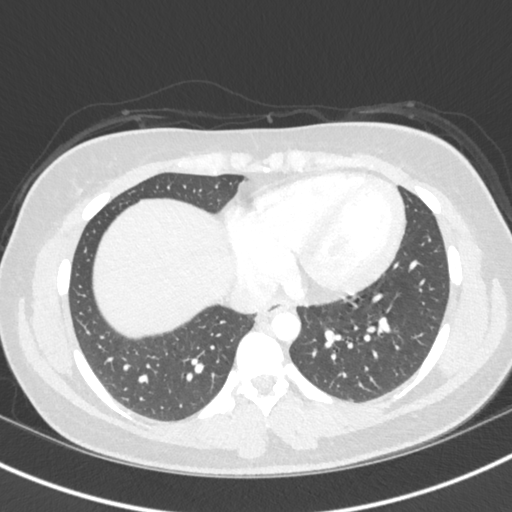
[im 172/364  lung]
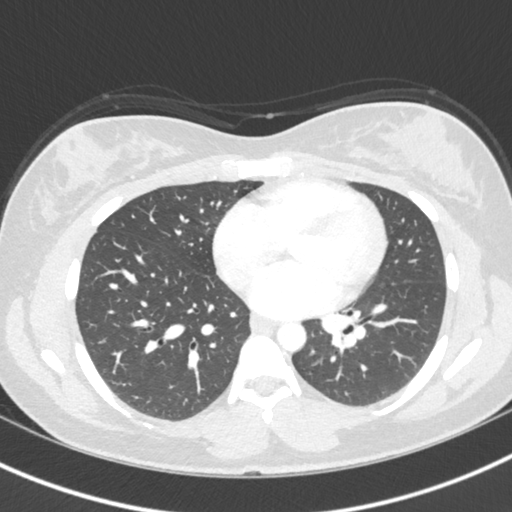
[im 192/364  lung]
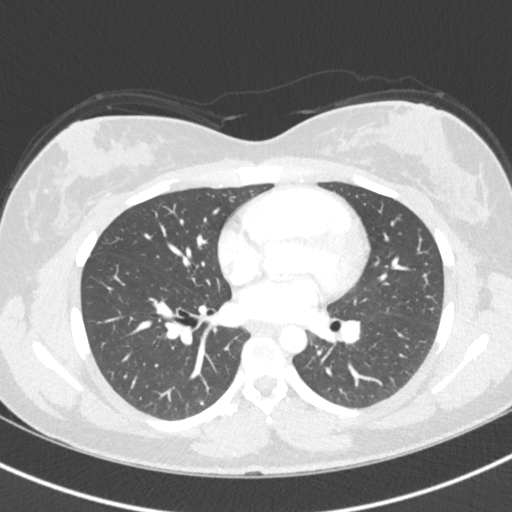
[im 230/364  lung]
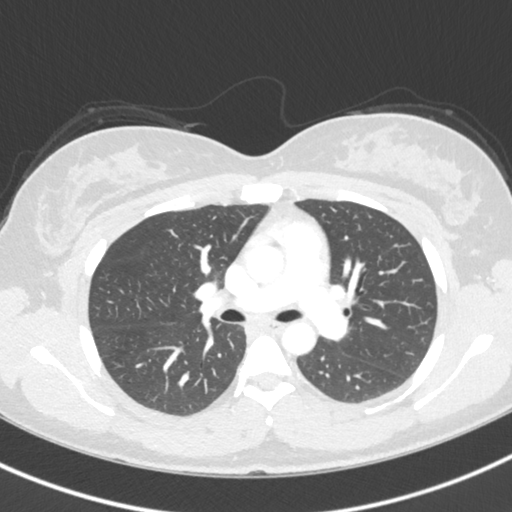
[im 249/364  mediastinal]
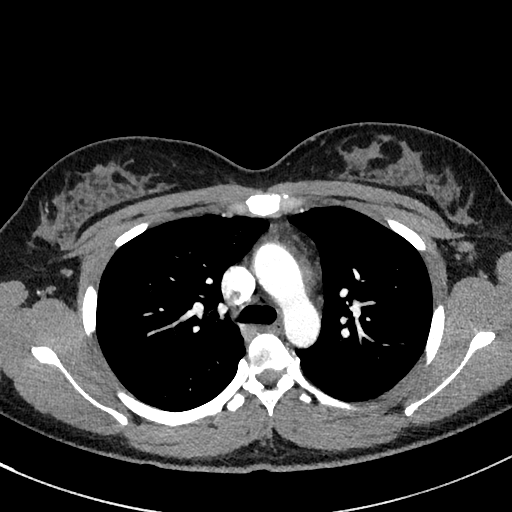
[im 249/364  lung]
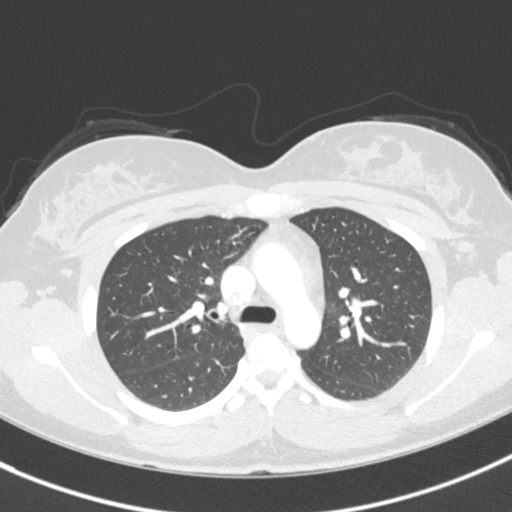
[im 287/364  lung]
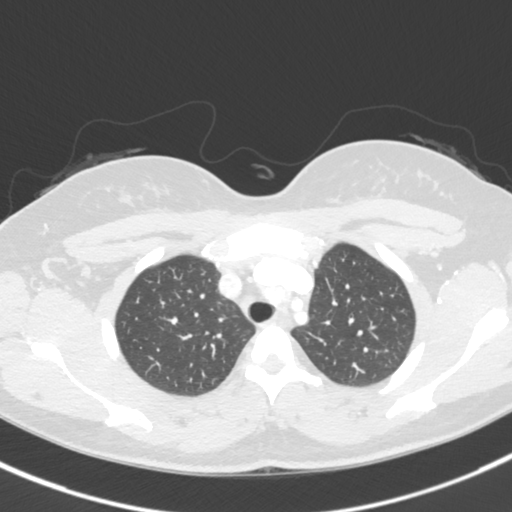
[im 306/364  lung]
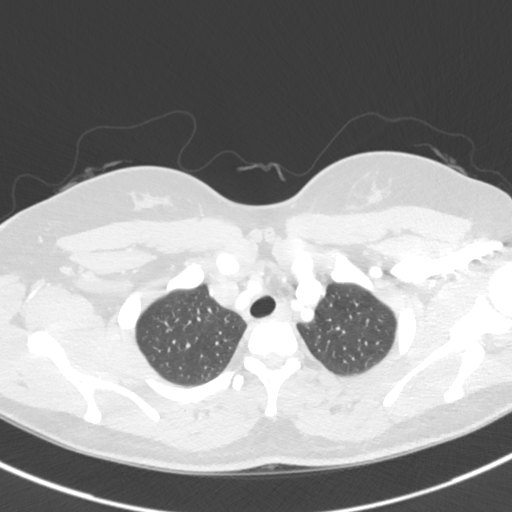
[im 344/364  lung]
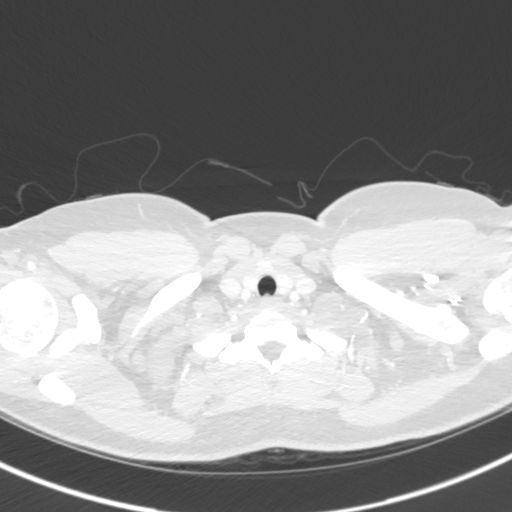

[Series 5: coronal · coronal · 0.59mm/px · 3 of 65 slices shown]
[im 13/65  lung]
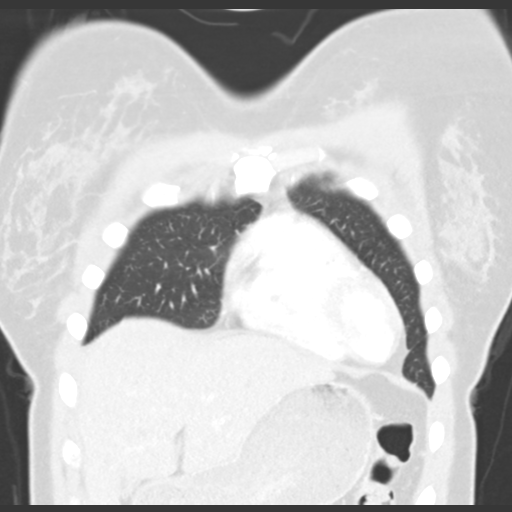
[im 26/65  lung]
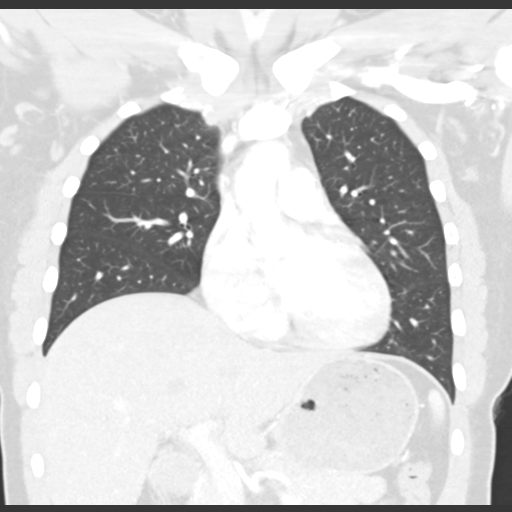
[im 39/65  lung]
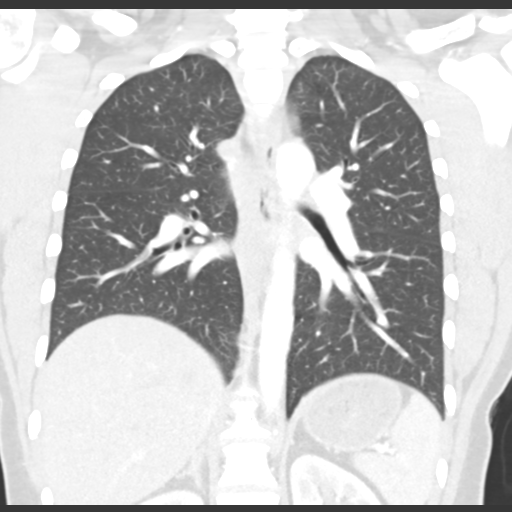

[15 of 36 positions shown; findings below may reference images not displayed]

FINDINGS: Cardiovascular: The heart size is normal. No substantial pericardial
effusion.

Mediastinum/Nodes: No mediastinal lymphadenopathy. There is no hilar
lymphadenopathy. The esophagus has normal imaging features. There is
no axillary lymphadenopathy.

Lungs/Pleura: The central tracheobronchial airways are patent. Right
lung clear. 2.9 x 3.3 x 1.6 cm heterogeneous multi lobular lesion of
varying attenuation identified posterior left lower lobe, not
substantially changed in the interval. Left lung otherwise clear.

Upper Abdomen: Unremarkable.

Musculoskeletal: No worrisome lytic or sclerotic osseous
abnormality.
IMPRESSION: 1. 3 cm multi lobular mixed attenuation lesion posterior left lower
lobe, similar to prior study.

## 2019-09-17 IMAGING — CR DG CHEST 2V
2 series · 2 of 2 positions shown · non-contrast
Comparison: 04/30/2018.

CLINICAL DATA: Chest pain.

EXAM:
CHEST - 2 VIEW

[chest pa]
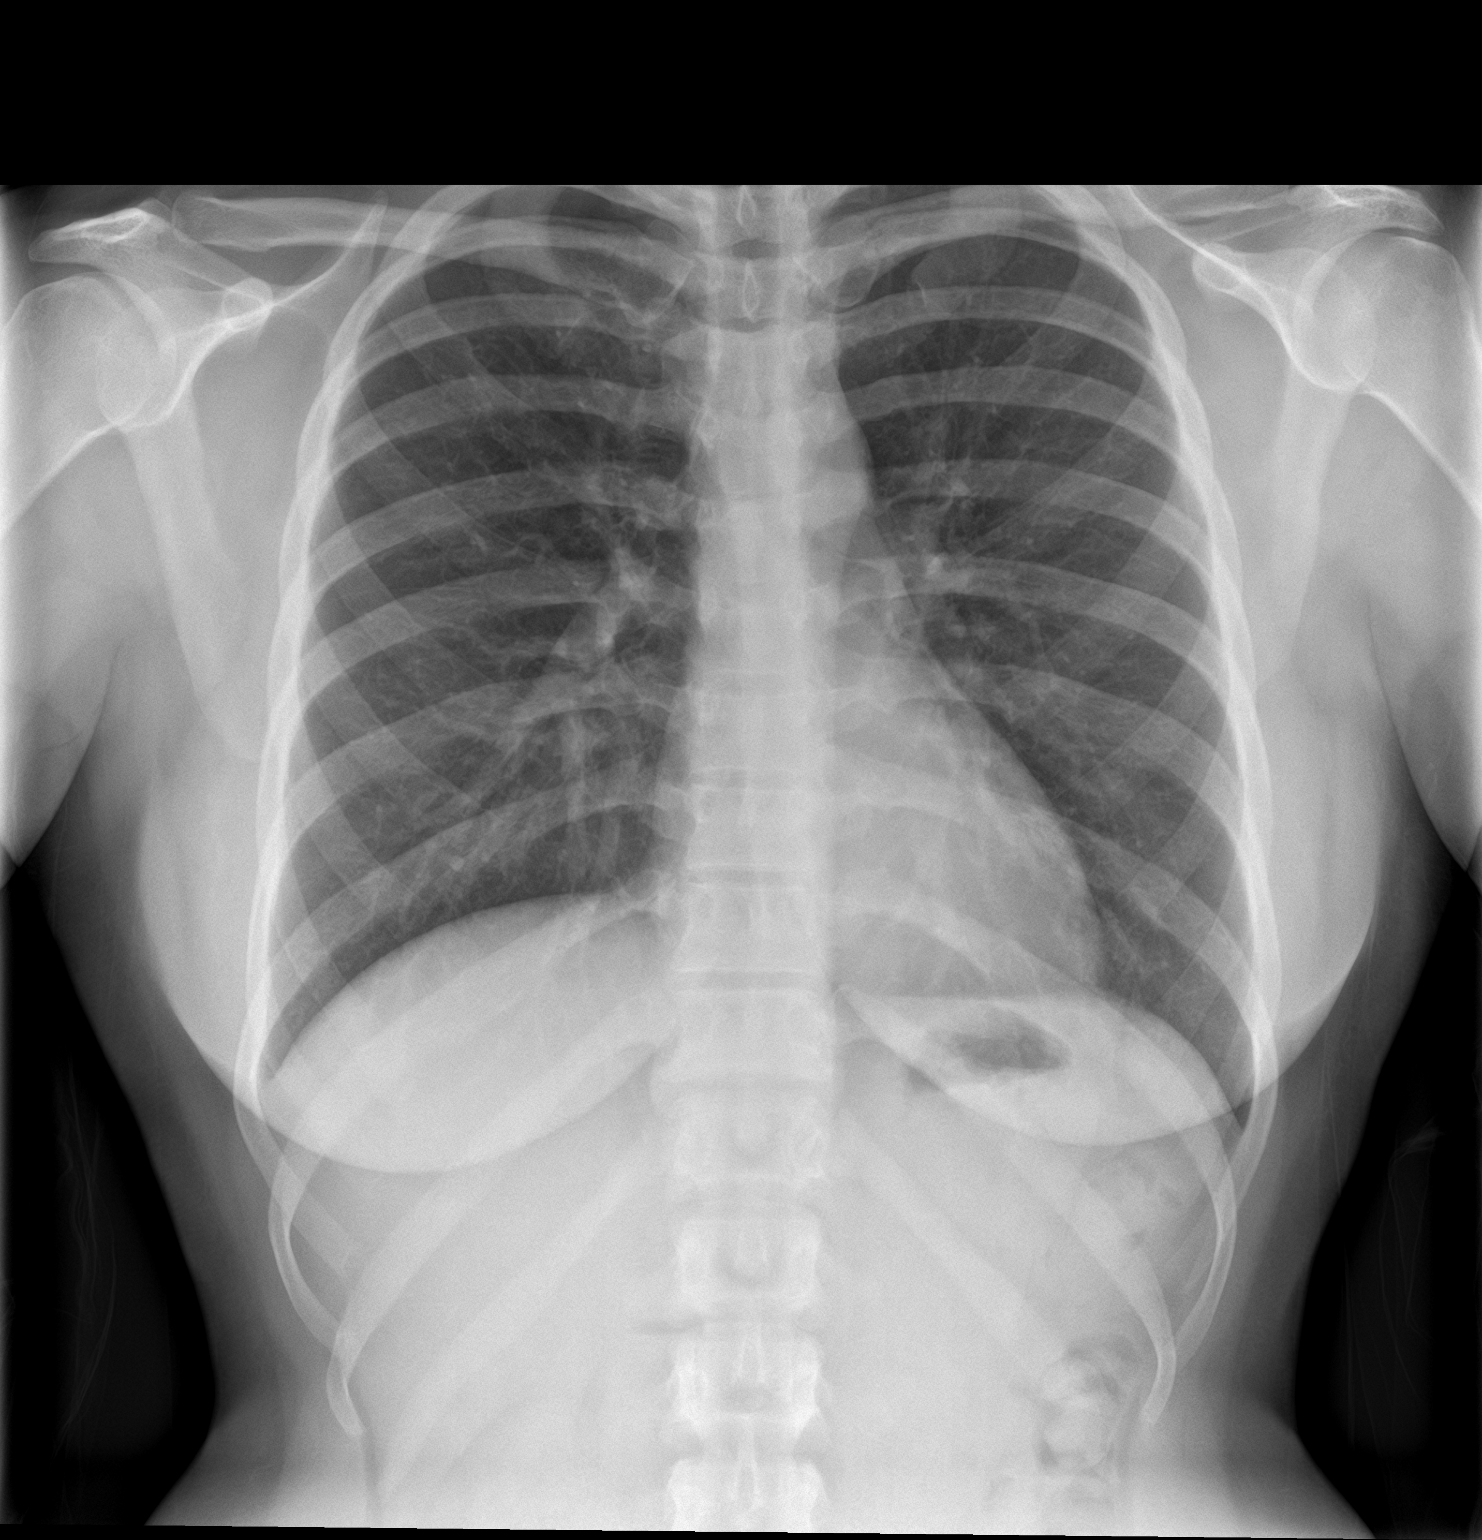

[chest lat]
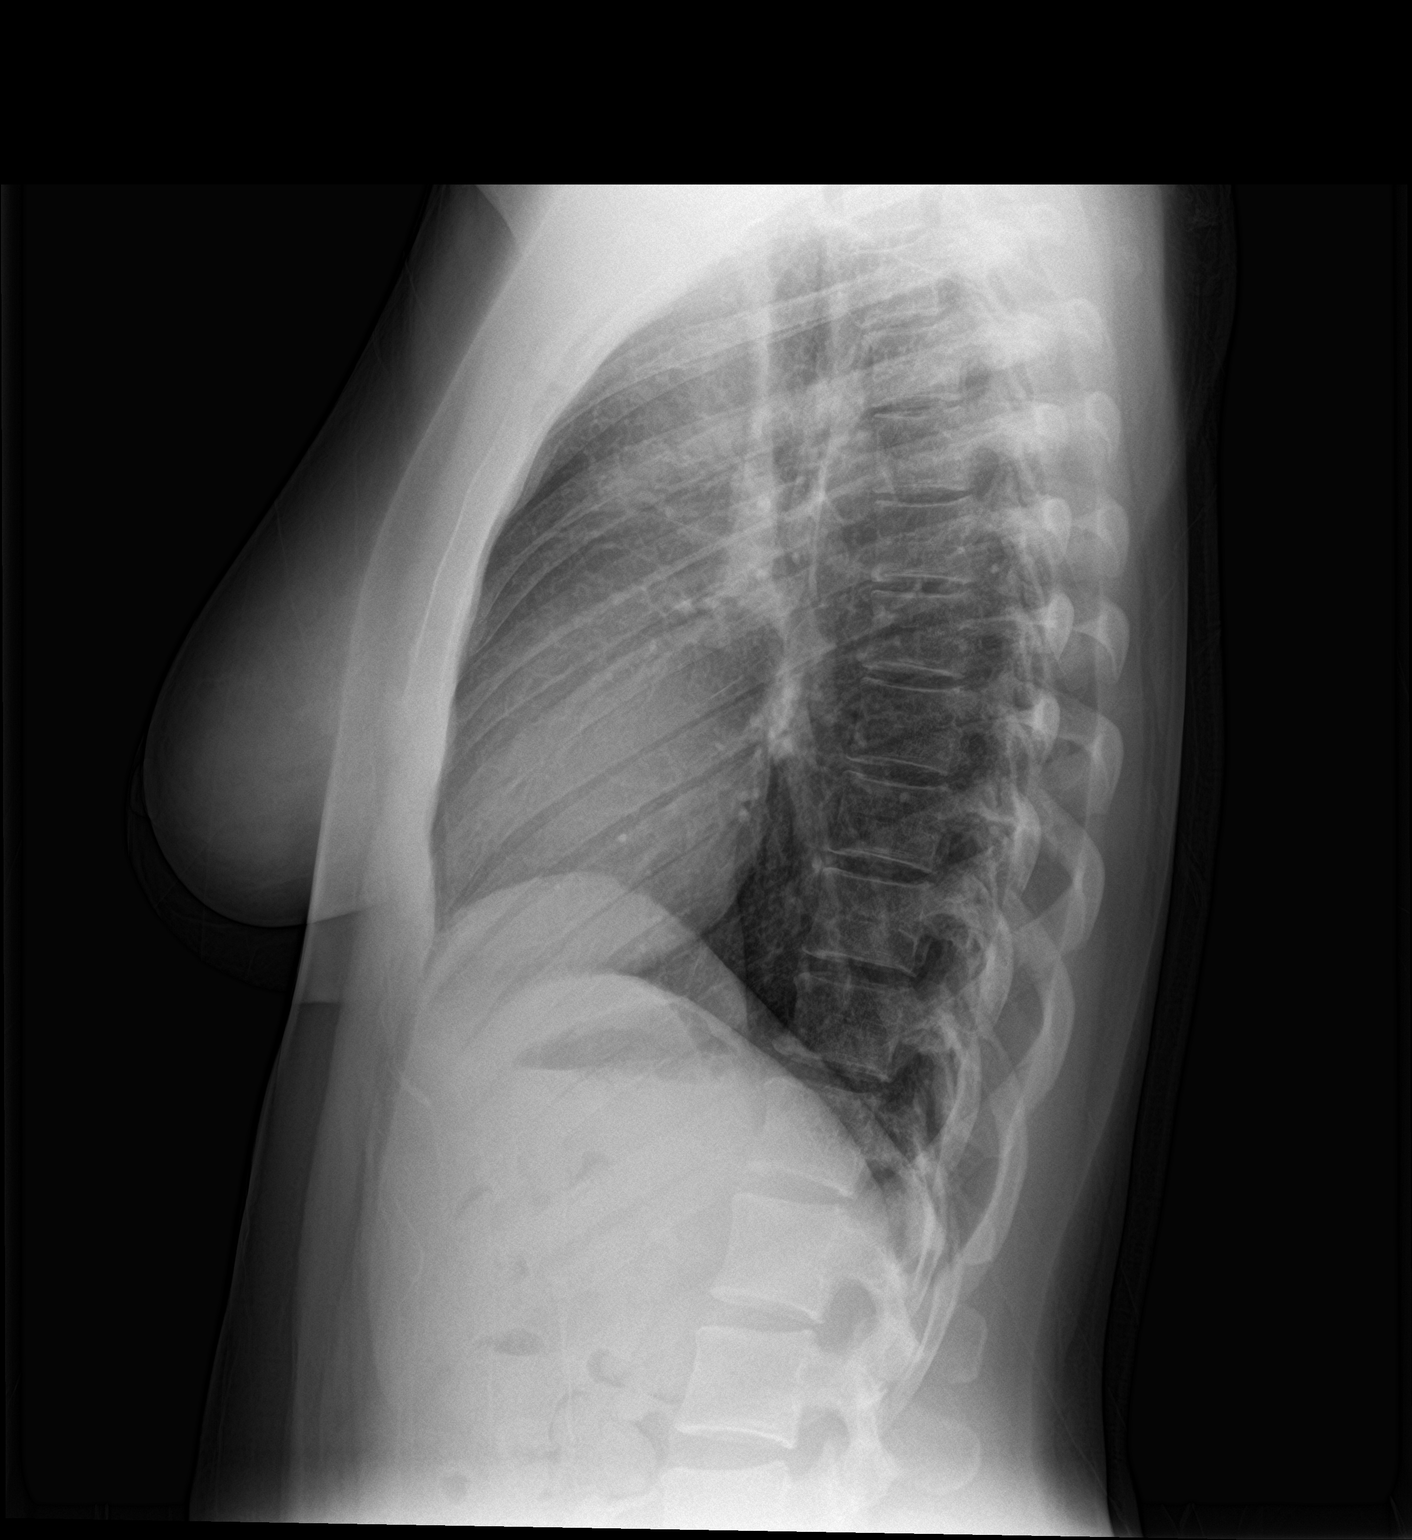

[2 of 2 positions shown; findings below may reference images not displayed]

FINDINGS: Mediastinum and hilar structures normal. Lungs are clear. No pleural
effusion or pneumothorax. Heart size normal. No acute bony
abnormality.
IMPRESSION: No acute cardiopulmonary disease.

## 2020-09-18 IMAGING — MR MR PELVIS WO/W CM
7 of 9 series · 28 of 48 positions shown · IV contrast (gadavist)
Comparison: CT on 04/21/2018

CLINICAL DATA: Left lower quadrant pain and dyspareunia. Pelvic
mass on exam. Endometriosis.

EXAM:
MRI PELVIS WITHOUT AND WITH CONTRAST
TECHNIQUE: Multiplanar multisequence MR imaging of the pelvis was performed
both before and after administration of intravenous contrast.
CONTRAST:  7mL GADAVIST GADOBUTROL 1 MMOL/ML IV SOLN

[Series 2: T2 · coronal · 5.0mm · 1.41mm/px · 2 of 30 slices shown (1 of 2)]
[im 1/30]
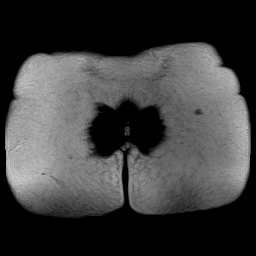
[im 30/30]
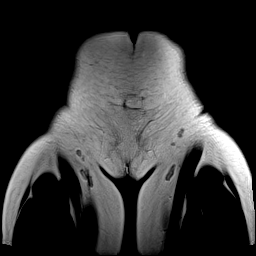

[Series 3: T2 · axial · 5.0mm · 0.75mm/px · z∈[-112,+104]mm · 3 of 37 slices shown (2 of 2)]
[im 1/37]
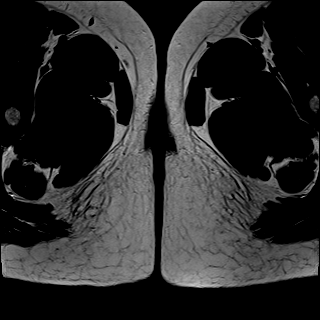
[im 19/37]
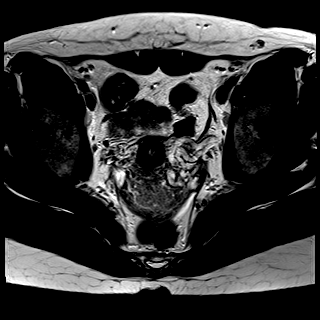
[im 37/37]
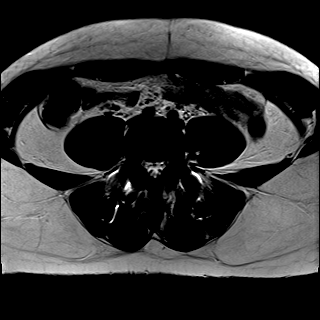

[Series 4: T2 fat-sat · axial · 5.0mm · 0.90mm/px · z∈[-112,+104]mm · 3 of 37 slices shown]
[im 1/37]
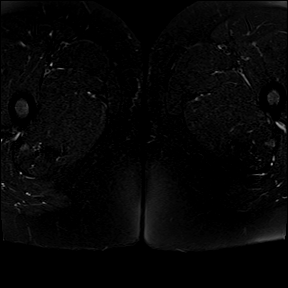
[im 19/37]
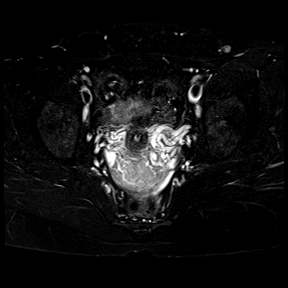
[im 37/37]
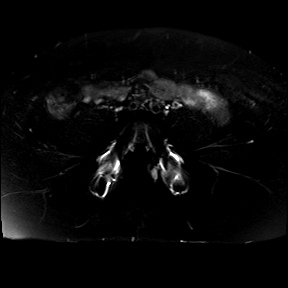

[Series 5: sag tse · sagittal · 5.0mm · 0.75mm/px · 3 of 33 slices shown]
[im 1/33]
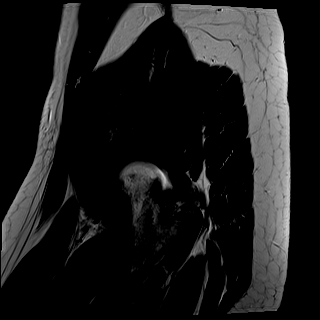
[im 17/33]
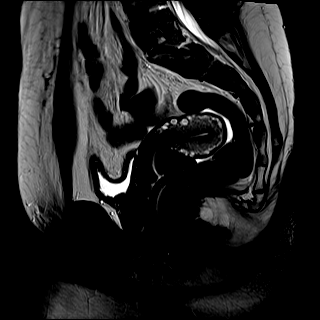
[im 33/33]
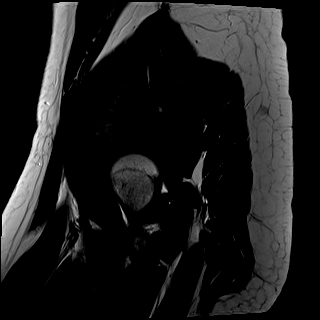

[Series 7: T1 dynamic fat-sat · axial · 3.0mm · 0.47mm/px · z∈[-122,+115]mm · 7 of 80 slices shown (1 of 2)]
[im 1/80]
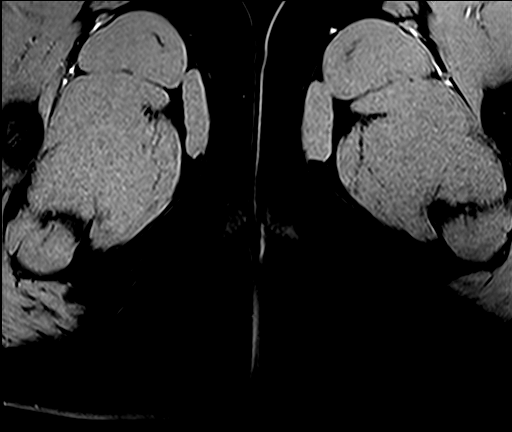
[im 14/80]
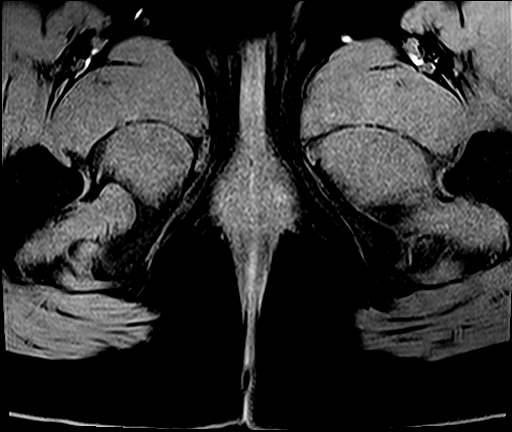
[im 27/80]
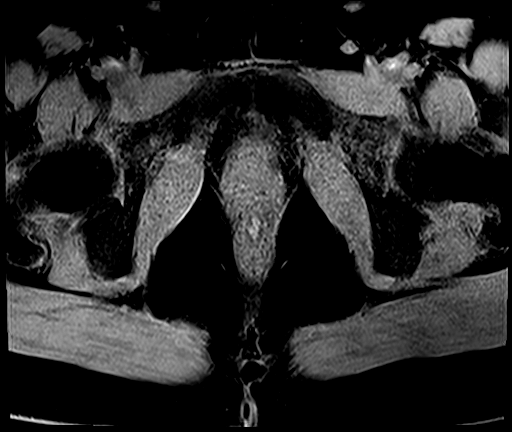
[im 40/80]
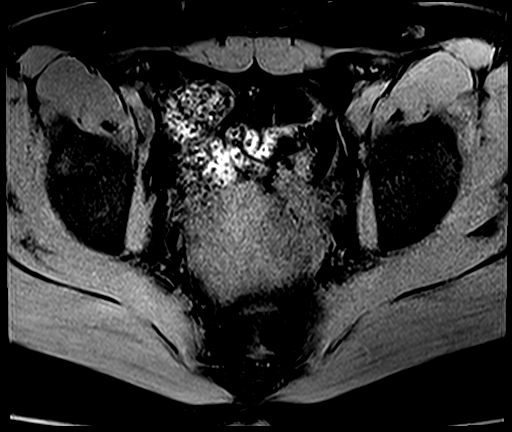
[im 53/80]
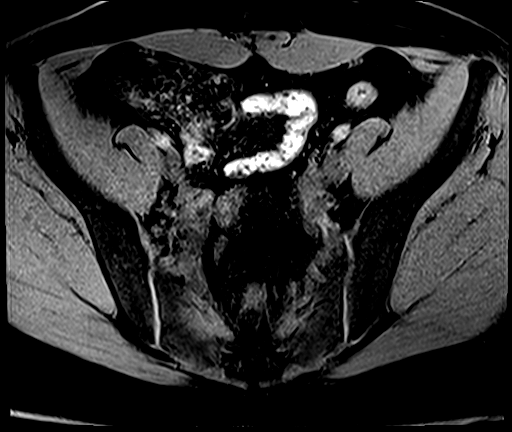
[im 66/80]
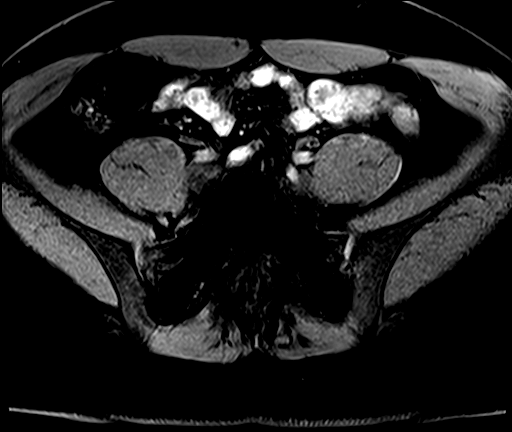
[im 80/80]
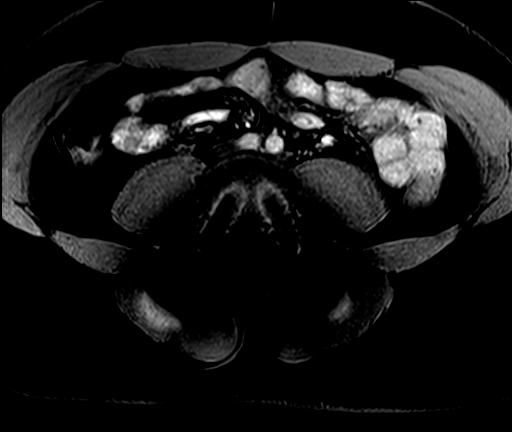

[Series 8: T1 dynamic fat-sat · axial · 3.0mm · 0.47mm/px · z∈[-122,+115]mm · 8 of 160 slices shown (2 of 2)]
[im 1/160]
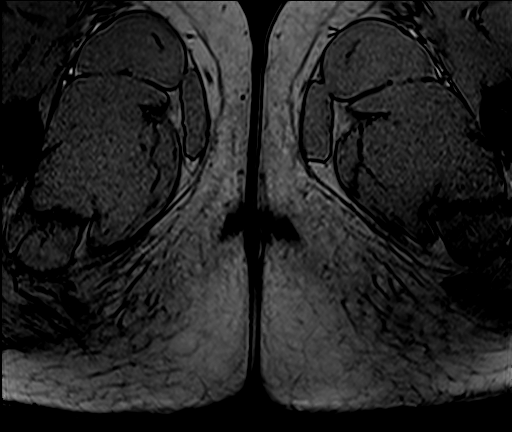
[im 25/160]
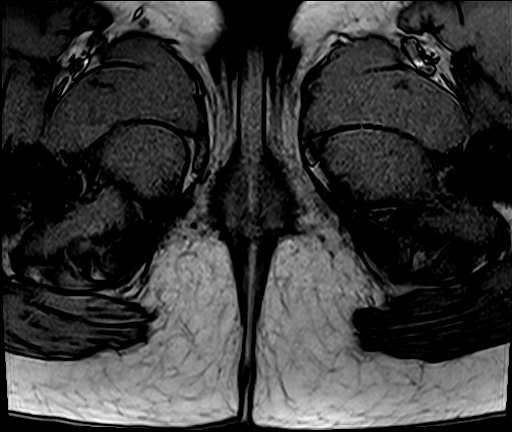
[im 49/160]
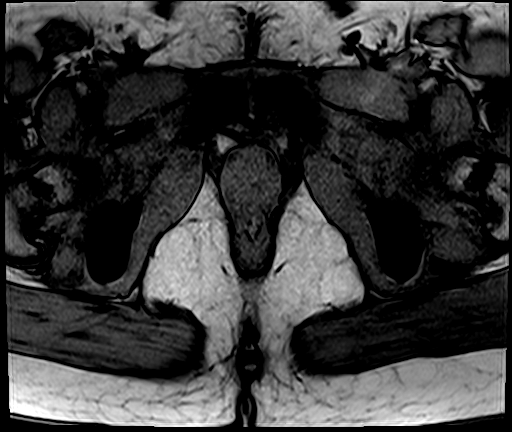
[im 74/160]
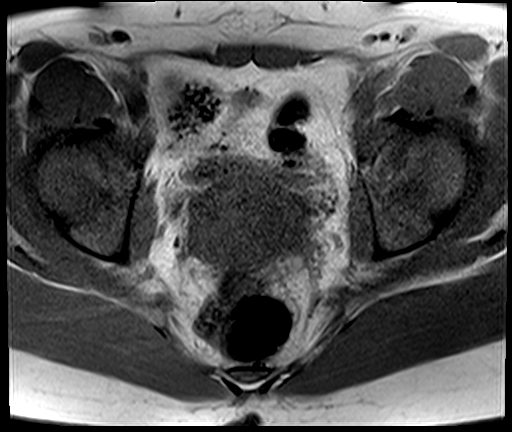
[im 86/160]
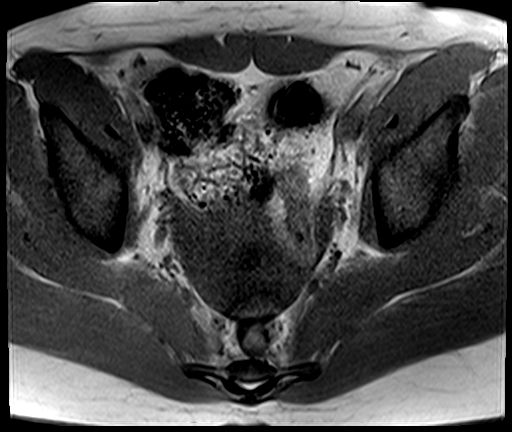
[im 111/160]
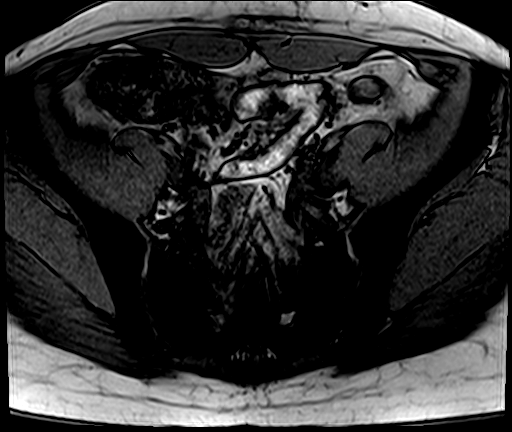
[im 135/160]
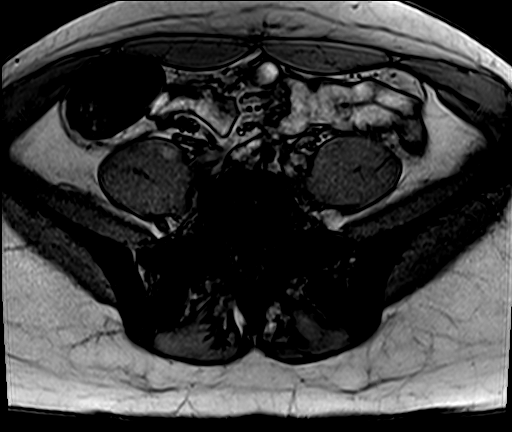
[im 160/160]
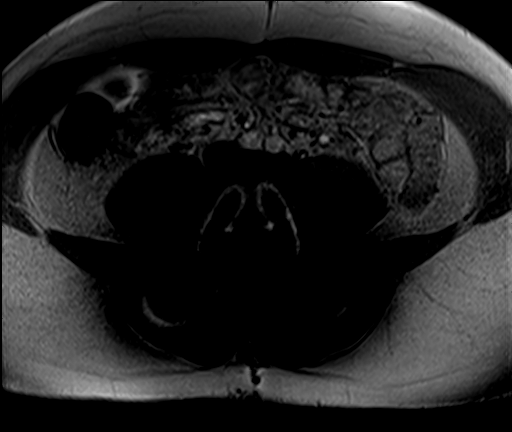

[Series 10: T1 dynamic · axial · 3.0mm · 0.47mm/px · z∈[-122,-83]mm · 2 of 80 slices shown]
[im 1/80]
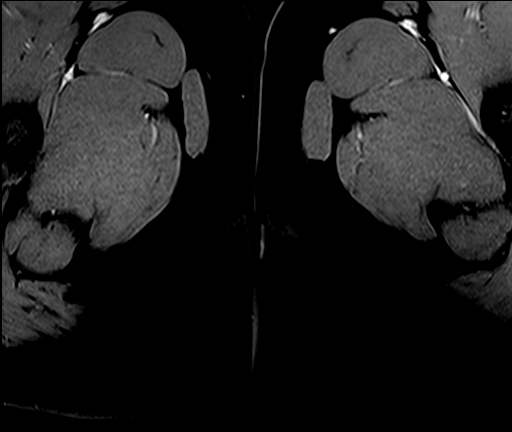
[im 14/80]
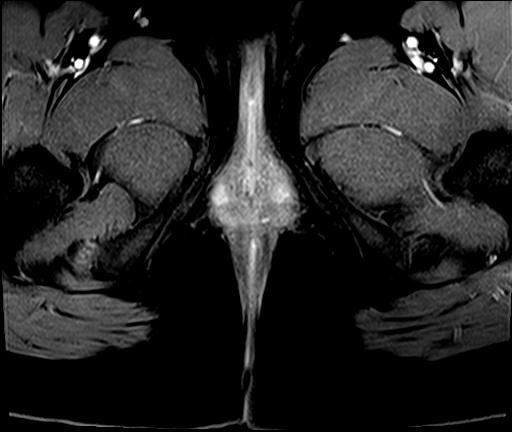

[28 of 48 positions shown; findings below may reference images not displayed]

FINDINGS: Lower Urinary Tract: No urinary bladder or urethral abnormality
identified.

Bowel: Unremarkable appearance of rectum and other pelvic bowel
loops.

Vascular/Lymphatic: Unremarkable. No pathologically enlarged pelvic
lymph nodes identified.

Reproductive:

-- Uterus: Measures 7.3 x 3.4 x 4.9 cm (volume = 64 cm^3).
Retroflexed. No fibroids or other masses identified. Cervix and
vagina are unremarkable.

-- Right ovary: Appears normal. No ovarian or adnexal masses
identified.

-- Left ovary: Appears normal. No ovarian or adnexal masses
identified.

Other: No peritoneal thickening or enhancement. Tiny amount of free
fluid is nonspecific, but most likely physiologic in a reproductive
age female.

Musculoskeletal: Unremarkable. Lumbosacral spine fusion hardware
noted.
IMPRESSION: Normal appearance of uterus and both ovaries. No pelvic mass or
other significant abnormality identified.

## 2023-02-18 ENCOUNTER — Encounter (HOSPITAL_COMMUNITY): Payer: Self-pay | Admitting: Emergency Medicine

## 2023-02-18 ENCOUNTER — Emergency Department (HOSPITAL_COMMUNITY): Payer: No Typology Code available for payment source

## 2023-02-18 ENCOUNTER — Emergency Department (HOSPITAL_COMMUNITY)
Admission: EM | Admit: 2023-02-18 | Discharge: 2023-02-18 | Disposition: A | Discharge status: Home / Self Care | Payer: No Typology Code available for payment source | Attending: Emergency Medicine | Admitting: Emergency Medicine

## 2023-02-18 DIAGNOSIS — Y92410 Unspecified street and highway as the place of occurrence of the external cause: Secondary | ICD-10-CM | POA: Diagnosis not present

## 2023-02-18 DIAGNOSIS — S59902A Unspecified injury of left elbow, initial encounter: Secondary | ICD-10-CM

## 2023-02-18 DIAGNOSIS — J45909 Unspecified asthma, uncomplicated: Secondary | ICD-10-CM | POA: Diagnosis not present

## 2023-02-18 DIAGNOSIS — S40812A Abrasion of left upper arm, initial encounter: Secondary | ICD-10-CM | POA: Diagnosis not present

## 2023-02-18 DIAGNOSIS — S51012A Laceration without foreign body of left elbow, initial encounter: Secondary | ICD-10-CM | POA: Diagnosis present

## 2023-02-18 LAB — I-STAT BETA HCG BLOOD, ED (MC, WL, AP ONLY): I-stat hCG, quantitative: <5 m[IU]/mL (ref <5)

## 2023-02-18 MED ORDER — MORPHINE SULFATE (PF) 4 MG/ML IV SOLN
4.0000 mg | Freq: Once | INTRAVENOUS | Status: AC
  Administered 2023-02-18: 4 mg via INTRAVENOUS
  Filled 2023-02-18: qty 1

## 2023-02-18 MED ORDER — LIDOCAINE-EPINEPHRINE (PF) 2 %-1:200000 IJ SOLN
10.0000 mL | Freq: Once | INTRAMUSCULAR | Status: AC
  Administered 2023-02-18: 10 mL
  Filled 2023-02-18: qty 20

## 2023-02-18 MED ORDER — BACITRACIN ZINC 500 UNIT/GM EX OINT: 1.0000 | TOPICAL_OINTMENT | Freq: Two times a day (BID) | CUTANEOUS | 0 refills | Status: AC

## 2023-02-18 MED ORDER — BACITRACIN ZINC 500 UNIT/GM EX OINT
TOPICAL_OINTMENT | Freq: Once | CUTANEOUS | Status: AC
  Filled 2023-02-18: qty 1.8

## 2023-02-18 MED ORDER — ONDANSETRON HCL 4 MG/2ML IJ SOLN
4.0000 mg | Freq: Once | INTRAMUSCULAR | Status: AC
  Administered 2023-02-18: 4 mg via INTRAVENOUS
  Filled 2023-02-18: qty 2

## 2023-02-18 NOTE — Progress Notes (Signed)
Orthopedic Tech Progress Note Patient Details:  Kathy Welch [facility] May 05, 1986 [id number]  Ortho Devices Type of Ortho Device: Long arm splint, Shoulder immobilizer Ortho Device/Splint Location: LUE Ortho Device/Splint Interventions: Ordered, Application, Adjustment   Post Interventions Patient Tolerated: Well  Al Decant 02/18/2023, 9:30 PM

## 2023-02-18 NOTE — ED Notes (Signed)
Ortho tech contacted to place splint and sling
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
## 2023-02-18 NOTE — Discharge Instructions (Addendum)
Return to ED or go to urgent care or PCP for suture removal in 7 days. Clean the laceration and abrasions with soap and water. Use a light layer of bacitracin as needed on the abrasions on your arm.  Alternate between Ibuprofen and Tylenol every 4 hours as needed for pain.  Wear sling and splint until you are able to bee seen by ortho for further evaluation. Call Dr. Jorja Loa Murphy's office to make an appointment for further evaluation of possible left elbow fracture.
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
## 2024-07-27 ENCOUNTER — Ambulatory Visit: Payer: Self-pay

## 2024-07-27 ENCOUNTER — Ambulatory Visit (LOCAL_COMMUNITY_HEALTH_CENTER): Payer: Self-pay

## 2024-07-27 VITALS — BP 122/85 | HR 77 | Ht 62.0 in | Wt 149.2 lb

## 2024-07-27 DIAGNOSIS — Z3046 Encounter for surveillance of implantable subdermal contraceptive: Secondary | ICD-10-CM

## 2024-07-27 DIAGNOSIS — Z3009 Encounter for other general counseling and advice on contraception: Secondary | ICD-10-CM

## 2024-07-27 DIAGNOSIS — Z30017 Encounter for initial prescription of implantable subdermal contraceptive: Secondary | ICD-10-CM

## 2024-07-27 MED ORDER — ETONOGESTREL 68 MG ~~LOC~~ IMPL
68.0000 mg | DRUG_IMPLANT | Freq: Once | SUBCUTANEOUS | Status: AC
  Administered 2024-07-27: 68 mg via SUBCUTANEOUS

## 2024-07-27 NOTE — Progress Notes (Signed)
 [facility] DEPART[facility] [address], [address] [place] [address] Main phone: [phone]  Family Planning Visit - Initial Visit  Subjective:  Kathy Welch is a 37 y.o.  No obstetric history on file.   being seen today for an initial annual visit and to discuss reproductive life planning.  The patient is currently using female condom for pregnancy prevention. She has a Nexplanon in place that was inserted 6 years ago. Patient does not want a pregnancy in the next year.   Patient reports they would like a new Nexplanon inserted today.  Patient has the following medical conditions: Patient Active Problem List   Diagnosis Date Noted   Rash of face 09/15/2018   Pneumothorax on left 06/11/2018   Sarcoidosis 05/19/2018   Asthma 05/19/2018   Endometriosis determined by laparoscopy 01/22/2016   Chief Complaint  Patient presents with   Acute Visit    Pt is here Nexplanon removal and reinsertion   HPI Patient reports desire for Nexplanon removal and reinsertion. Her Nexplanon was placed 6 years ago. Primary reason she has the Nexplanon for cycle control and she does have a history of endometriosis.   Patient denies other concerns. She is only here for her Nexplanon removal/re-insertion, she had a physical exam, pap and STI testing at [facility].   Review of Systems  All other systems reviewed and are negative.  Cervical Cancer Screening  Normal/NILM in 2025  Health Maintenance Due  Topic Date Due   COVID-19 Vaccine (1) Never done   Hepatitis C Screening  Never done   Pneumococcal Vaccine (1 of 2 - PCV) Never done   Hepatitis B Vaccines 19-59 Average Risk (1 of 3 - 19+ 3-dose series) Never done   HPV VACCINES (1 - 3-dose SCDM series) Never done   Cervical Cancer Screening (HPV/Pap Cotest)  Never done   Influenza Vaccine  04/09/2024   The following portions of the patient's history were reviewed and updated  as appropriate: allergies, current medications, past family history, past medical history, past social history, past surgical history and problem list. Problem list updated.  See flowsheet for further details and programmatic requirements Hyperlink available at the top of the signed note in blue.  Flow sheet content below:  Pregnancy Intention Screening Does the patient want to become pregnant in the next year?: No Does the patient's partner want to become pregnant in the next year?: No Would the patient like to discuss contraceptive options today?: Yes Other:  Password: [id number]  Objective:   Vitals:   07/27/24 0825  BP: 122/85  Pulse: 77  Weight: 149 lb 3.2 oz (67.7 kg)  Height: 5' 2 (1.575 m)   Physical Exam Constitutional:      Appearance: Normal appearance.  HENT:     Head: Normocephalic.     Mouth/Throat:     Mouth: Mucous membranes are moist.  Eyes:     General: No scleral icterus.       Right eye: No discharge.        Left eye: No discharge.  Pulmonary:     Effort: Pulmonary effort is normal.  Skin:    General: Skin is warm and dry.  Neurological:     General: No focal deficit present.     Mental Status: She is alert.  Psychiatric:        Mood and Affect: Mood normal.        Behavior: Behavior normal.     Assessment  and Plan:  Kathy Welch is a 38 y.o. female presenting to the [facility] Department for an initial annual wellness/contraceptive visit  Family planning  Contraception counseling:  Reviewed options based on patient desire and reproductive life plan. Patient is interested in Hormonal Implant. This was provided to the patient today.  Risks, benefits, and typical effectiveness rates were reviewed.  Questions were answered.  Written information was also given to the patient to review.    The patient was told to call with any further questions, or with any concerns about this method of contraception.  Emphasized use of condoms  100% of the time for STI prevention.  Emergency Contraception Precautions (ECP): Patient assessed for need of ECP. She is not a candidate based on no intercourse for >5 days, condom use for all sex.   Referral sent to [facility] for endometriosis follow up.  2. Nexplanon removal  2. Nexplanon insertion (Primary)  - etonogestrel (NEXPLANON) implant 68 mg  Procedure:  Nexplanon Removal Patient identified, informed consent performed, consent signed.   Appropriate time out taken. Nexplanon site identified in the patient's left arm.  Area prepped in usual sterile fashon. 1 ml of 1% lidocaine  with Epinephrine  was used to anesthetize the area at the distal end of the implant and along implant site. A small stab incision was made right beside the implant on the distal portion.  Significant scar tissue and a deeply placed implant made this removal more difficulty. Dr. Dorothyann Helling was called to assist toward the end. The Nexplanon rod was grasped using hemostats and once scar tissue capsule was removed using sharp dissection was removed without difficulty.  There was minimal blood loss. There were no complications.  Steri-strips were applied over the small incision.  A pressure bandage was applied to reduce any bruising.  The patient tolerated the procedure well and was given post procedure instructions.     Procedure:  Nexplanon Insertion  Patient identified, informed consent performed, consent signed.   Patient does understand that irregular bleeding is a very common side effect of this medication. She was advised to have backup contraception after placement. Patient was determined to meet WHO criteria for not being pregnant. Appropriate time out taken.  The insertion site was identified 8-10 cm (3-4 inches) from the medial epicondyle of the humerus and 3-5 cm (1.25-2 inches) posterior to (below) the sulcus (groove) between the biceps and triceps muscles of the patient's left arm and marked.  Patient was  prepped with alcohol swab and then injected with 3 ml of 1% lidocaine . Area prepped with chlorhexidine , Nexplanon removed from packaging, device confirmed in needle, then inserted full length of needle and withdrawn per handbook instructions. Nexplanon was able to palpated in the patient's arm; patient palpated the insert herself. There was minimal blood loss.  Patient insertion site covered with gauze and a pressure bandage to reduce any bruising.  The patient tolerated the procedure well and was given post procedure instructions.    Return if symptoms worsen or fail to improve.  No future appointments.  Damien FORBES Satchel, NP

## 2024-07-27 NOTE — Progress Notes (Signed)
 Pt is here for Nexplanon removal and reinsertion.  Expired Nexplanon removed and a new Implant inserted successfully into the same Lt Arm by Damien Satchel, [facility] and  pt tolerated well to the process with no complications. Opportunity given to pt to ask questions for any clarifications. Questions Answered. Wilkie Drought, RN.
